# Patient Record
Sex: Female | Born: 1940 | Race: Black or African American | Hispanic: No | Marital: Married | Smoking: Never smoker
Health system: Southern US, Community
[De-identification: ages and names within clinical notes are randomized; demographics above are authoritative.]

## PROBLEM LIST (undated history)

## (undated) DIAGNOSIS — I10 Essential (primary) hypertension: Secondary | ICD-10-CM

## (undated) DIAGNOSIS — E785 Hyperlipidemia, unspecified: Secondary | ICD-10-CM

## (undated) DIAGNOSIS — J45909 Unspecified asthma, uncomplicated: Secondary | ICD-10-CM

## (undated) DIAGNOSIS — K219 Gastro-esophageal reflux disease without esophagitis: Secondary | ICD-10-CM

## (undated) DIAGNOSIS — R1310 Dysphagia, unspecified: Secondary | ICD-10-CM

## (undated) DIAGNOSIS — I2510 Atherosclerotic heart disease of native coronary artery without angina pectoris: Secondary | ICD-10-CM

## (undated) DIAGNOSIS — R1319 Other dysphagia: Secondary | ICD-10-CM

## (undated) HISTORY — PX: TUBAL LIGATION: SHX77

## (undated) HISTORY — PX: CORONARY ANGIOPLASTY: SHX604

## (undated) HISTORY — PX: DILATION AND CURETTAGE, DIAGNOSTIC / THERAPEUTIC: SUR384

## (undated) HISTORY — PX: CATARACT EXTRACTION: SUR2

## (undated) HISTORY — PX: CARDIAC CATHETERIZATION: SHX172

## (undated) HISTORY — PX: CHOLECYSTECTOMY: SHX55

---
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
## 2006-11-18 ENCOUNTER — Other Ambulatory Visit: Payer: Self-pay

## 2006-11-18 ENCOUNTER — Emergency Department: Payer: Self-pay | Admitting: Emergency Medicine

## 2008-06-02 ENCOUNTER — Ambulatory Visit: Payer: Self-pay | Admitting: Internal Medicine

## 2009-07-28 ENCOUNTER — Ambulatory Visit: Payer: Self-pay | Admitting: Internal Medicine

## 2009-12-13 ENCOUNTER — Emergency Department: Payer: Self-pay | Admitting: Emergency Medicine

## 2010-01-11 ENCOUNTER — Ambulatory Visit: Payer: Self-pay | Admitting: Specialist

## 2011-07-04 ENCOUNTER — Ambulatory Visit: Payer: Self-pay | Admitting: Internal Medicine

## 2012-07-24 ENCOUNTER — Ambulatory Visit: Payer: Self-pay | Admitting: Internal Medicine

## 2012-12-23 ENCOUNTER — Ambulatory Visit: Payer: Self-pay | Admitting: Internal Medicine

## 2013-11-16 ENCOUNTER — Emergency Department: Payer: Self-pay | Admitting: Emergency Medicine

## 2014-01-12 ENCOUNTER — Ambulatory Visit: Payer: Self-pay | Admitting: Internal Medicine

## 2014-11-11 ENCOUNTER — Other Ambulatory Visit: Payer: Self-pay | Admitting: Otolaryngology

## 2014-11-11 DIAGNOSIS — IMO0001 Reserved for inherently not codable concepts without codable children: Secondary | ICD-10-CM

## 2014-11-11 DIAGNOSIS — H918X1 Other specified hearing loss, right ear: Secondary | ICD-10-CM

## 2014-11-18 ENCOUNTER — Ambulatory Visit
Admission: RE | Admit: 2014-11-18 | Discharge: 2014-11-18 | Disposition: A | Discharge status: Home / Self Care | Payer: Medicare PPO | Source: Ambulatory Visit | Attending: Otolaryngology | Admitting: Otolaryngology

## 2014-11-18 DIAGNOSIS — IMO0001 Reserved for inherently not codable concepts without codable children: Secondary | ICD-10-CM

## 2014-11-18 DIAGNOSIS — H905 Unspecified sensorineural hearing loss: Secondary | ICD-10-CM | POA: Diagnosis not present

## 2014-11-18 DIAGNOSIS — H918X1 Other specified hearing loss, right ear: Secondary | ICD-10-CM

## 2014-11-18 MED ORDER — GADOBENATE DIMEGLUMINE 529 MG/ML IV SOLN
20.0000 mL | Freq: Once | INTRAVENOUS | Status: AC | PRN
  Administered 2014-11-18: 17 mL via INTRAVENOUS

## 2015-07-01 ENCOUNTER — Encounter: Payer: Self-pay | Admitting: *Deleted

## 2015-07-04 ENCOUNTER — Ambulatory Visit: Payer: Medicare PPO | Admitting: Anesthesiology

## 2015-07-04 ENCOUNTER — Encounter
Admission: RE | Disposition: A | Discharge status: Home / Self Care | Payer: Self-pay | Source: Ambulatory Visit | Attending: Gastroenterology

## 2015-07-04 ENCOUNTER — Encounter: Payer: Self-pay | Admitting: *Deleted

## 2015-07-04 ENCOUNTER — Ambulatory Visit
Admission: RE | Admit: 2015-07-04 | Discharge: 2015-07-04 | Disposition: A | Discharge status: Home / Self Care | Payer: Medicare PPO | Source: Ambulatory Visit | Attending: Gastroenterology | Admitting: Gastroenterology

## 2015-07-04 DIAGNOSIS — Z9049 Acquired absence of other specified parts of digestive tract: Secondary | ICD-10-CM | POA: Insufficient documentation

## 2015-07-04 DIAGNOSIS — Z9861 Coronary angioplasty status: Secondary | ICD-10-CM | POA: Insufficient documentation

## 2015-07-04 DIAGNOSIS — E785 Hyperlipidemia, unspecified: Secondary | ICD-10-CM | POA: Insufficient documentation

## 2015-07-04 DIAGNOSIS — Z79899 Other long term (current) drug therapy: Secondary | ICD-10-CM | POA: Diagnosis not present

## 2015-07-04 DIAGNOSIS — I10 Essential (primary) hypertension: Secondary | ICD-10-CM | POA: Diagnosis not present

## 2015-07-04 DIAGNOSIS — K640 First degree hemorrhoids: Secondary | ICD-10-CM | POA: Diagnosis not present

## 2015-07-04 DIAGNOSIS — I2510 Atherosclerotic heart disease of native coronary artery without angina pectoris: Secondary | ICD-10-CM | POA: Diagnosis not present

## 2015-07-04 DIAGNOSIS — K644 Residual hemorrhoidal skin tags: Secondary | ICD-10-CM | POA: Insufficient documentation

## 2015-07-04 DIAGNOSIS — D123 Benign neoplasm of transverse colon: Secondary | ICD-10-CM | POA: Diagnosis not present

## 2015-07-04 DIAGNOSIS — K5730 Diverticulosis of large intestine without perforation or abscess without bleeding: Secondary | ICD-10-CM | POA: Diagnosis not present

## 2015-07-04 DIAGNOSIS — Z1211 Encounter for screening for malignant neoplasm of colon: Secondary | ICD-10-CM | POA: Insufficient documentation

## 2015-07-04 DIAGNOSIS — Z7952 Long term (current) use of systemic steroids: Secondary | ICD-10-CM | POA: Insufficient documentation

## 2015-07-04 DIAGNOSIS — Z9889 Other specified postprocedural states: Secondary | ICD-10-CM | POA: Diagnosis not present

## 2015-07-04 DIAGNOSIS — D124 Benign neoplasm of descending colon: Secondary | ICD-10-CM | POA: Diagnosis not present

## 2015-07-04 DIAGNOSIS — J45909 Unspecified asthma, uncomplicated: Secondary | ICD-10-CM | POA: Insufficient documentation

## 2015-07-04 DIAGNOSIS — Z79891 Long term (current) use of opiate analgesic: Secondary | ICD-10-CM | POA: Diagnosis not present

## 2015-07-04 HISTORY — PX: COLONOSCOPY WITH PROPOFOL: SHX5780

## 2015-07-04 HISTORY — DX: Hyperlipidemia, unspecified: E78.5

## 2015-07-04 HISTORY — DX: Atherosclerotic heart disease of native coronary artery without angina pectoris: I25.10

## 2015-07-04 HISTORY — DX: Essential (primary) hypertension: I10

## 2015-07-04 HISTORY — DX: Unspecified asthma, uncomplicated: J45.909

## 2015-07-04 SURGERY — COLONOSCOPY WITH PROPOFOL
Anesthesia: General

## 2015-07-04 MED ORDER — SODIUM CHLORIDE 0.9 % IV SOLN
INTRAVENOUS | Status: DC
  Administered 2015-07-04 (×2): via INTRAVENOUS

## 2015-07-04 MED ORDER — MIDAZOLAM HCL 2 MG/2ML IJ SOLN
INTRAMUSCULAR | Status: DC | PRN
  Administered 2015-07-04: 1 mg via INTRAVENOUS

## 2015-07-04 MED ORDER — PROPOFOL 500 MG/50ML IV EMUL
INTRAVENOUS | Status: DC | PRN
  Administered 2015-07-04: 150 ug/kg/min via INTRAVENOUS

## 2015-07-04 MED ORDER — PROPOFOL 10 MG/ML IV BOLUS
INTRAVENOUS | Status: DC | PRN
  Administered 2015-07-04: 50 mg via INTRAVENOUS

## 2015-07-04 MED ORDER — FENTANYL CITRATE (PF) 100 MCG/2ML IJ SOLN
INTRAMUSCULAR | Status: DC | PRN
  Administered 2015-07-04: 50 ug via INTRAVENOUS

## 2015-07-04 NOTE — Anesthesia Preprocedure Evaluation (Signed)
Anesthesia Evaluation  Patient identified by MRN, date of birth, ID band Patient awake    Reviewed: Allergy & Precautions, H&P , NPO status , Patient's Chart, lab work & pertinent test results, reviewed documented beta blocker date and time   Airway Mallampati: II   Neck ROM: full    Dental  (+) Teeth Intact   Pulmonary neg pulmonary ROS, neg shortness of breath, asthma ,    Pulmonary exam normal        Cardiovascular Exercise Tolerance: Good hypertension, + CAD  negative cardio ROS Normal cardiovascular exam     Neuro/Psych negative neurological ROS  negative psych ROS   GI/Hepatic negative GI ROS, Neg liver ROS,   Endo/Other  negative endocrine ROS  Renal/GU negative Renal ROS  negative genitourinary   Musculoskeletal   Abdominal   Peds  Hematology negative hematology ROS (+)   Anesthesia Other Findings Past Medical History:   Hypertension                                                 Coronary artery disease                                      Asthma                                                       Hyperlipidemia                                             Past Surgical History:   CHOLECYSTECTOMY                                               CARDIAC CATHETERIZATION                                       CATARACT EXTRACTION                                           DILATION AND CURETTAGE, DIAGNOSTIC / THERAPEUT*               CORONARY ANGIOPLASTY                                        BMI    Body Mass Index   29.71 kg/m 2     Reproductive/Obstetrics                             Anesthesia Physical Anesthesia Plan  ASA: III  Anesthesia Plan:  General   Post-op Pain Management:    Induction:   Airway Management Planned:   Additional Equipment:   Intra-op Plan:   Post-operative Plan:   Informed Consent: I have reviewed the patients History and Physical, chart,  labs and discussed the procedure including the risks, benefits and alternatives for the proposed anesthesia with the patient or authorized representative who has indicated his/her understanding and acceptance.   Dental Advisory Given  Plan Discussed with: CRNA  Anesthesia Plan Comments:         Anesthesia Quick Evaluation

## 2015-07-04 NOTE — H&P (Signed)
  Primary Care Physician:  Madelyn Brunner, MD  Pre-Procedure History & Physical: HPI:  Melissa Alvarado is a 75 y.o. female is here for an colonoscopy.   Past Medical History  Diagnosis Date  . Hypertension   . Coronary artery disease   . Asthma   . Hyperlipidemia     Past Surgical History  Procedure Laterality Date  . Cholecystectomy    . Cardiac catheterization    . Cataract extraction    . Dilation and curettage, diagnostic / therapeutic    . Coronary angioplasty      Prior to Admission medications   Medication Sig Start Date End Date Taking? Authorizing Provider  Albuterol Sulfate 108 (90 Base) MCG/ACT AEPB Inhale 2 puffs into the lungs every 6 (six) hours as needed.   Yes Historical Provider, MD  azelastine (ASTELIN) 0.1 % nasal spray Place 1 spray into both nostrils 2 (two) times daily. Use in each nostril as directed   Yes Historical Provider, MD  conjugated estrogens (PREMARIN) vaginal cream Place 1 Applicatorful vaginally daily.   Yes Historical Provider, MD  gabapentin (NEURONTIN) 100 MG capsule Take 100 mg by mouth 3 (three) times daily.   Yes Historical Provider, MD  HYDROcodone-homatropine (HYCODAN) 5-1.5 MG/5ML syrup Take 5 mLs by mouth every 6 (six) hours as needed for cough.   Yes Historical Provider, MD  montelukast (SINGULAIR) 10 MG tablet Take 10 mg by mouth at bedtime. Reported on 07/04/2015   Yes Historical Provider, MD  potassium chloride (K-DUR,KLOR-CON) 10 MEQ tablet Take 10 mEq by mouth daily.   Yes Historical Provider, MD  predniSONE (DELTASONE) 20 MG tablet Take 20 mg by mouth daily with breakfast.   Yes Historical Provider, MD  quinapril-hydrochlorothiazide (ACCURETIC) 20-25 MG tablet Take 1 tablet by mouth daily.   Yes Historical Provider, MD    Allergies as of 06/08/2015  . (Not on File)    History reviewed. No pertinent family history.  Social History   Social History  . Marital Status: Married    Spouse Name: N/A  . Number of Children: N/A   . Years of Education: N/A   Occupational History  . Not on file.   Social History Main Topics  . Smoking status: Never Smoker   . Smokeless tobacco: Never Used  . Alcohol Use: No  . Drug Use: No  . Sexual Activity: Not on file   Other Topics Concern  . Not on file   Social History Narrative     Physical Exam: BP 142/50 mmHg  Pulse 84  Temp(Src) 97.1 F (36.2 C) (Tympanic)  Resp 16  Ht 5\' 6"  (1.676 m)  Wt 83.462 kg (184 lb)  BMI 29.71 kg/m2  SpO2 100% General:   Alert,  pleasant and cooperative in NAD Head:  Normocephalic and atraumatic. Neck:  Supple; no masses or thyromegaly. Lungs:  Clear throughout to auscultation.    Heart:  Regular rate and rhythm. Abdomen:  Soft, nontender and nondistended. Normal bowel sounds, without guarding, and without rebound.   Neurologic:  Alert and  oriented x4;  grossly normal neurologically.  Impression/Plan: Melissa Alvarado is here for an colonoscopy to be performed for screening  Risks, benefits, limitations, and alternatives regarding  colonoscopy have been reviewed with the patient.  Questions have been answered.  All parties agreeable.   Josefine Class, MD  07/04/2015, 9:20 AM

## 2015-07-04 NOTE — Transfer of Care (Signed)
Immediate Anesthesia Transfer of Care Note  Patient: Melissa Alvarado  Procedure(s) Performed: Procedure(s): COLONOSCOPY WITH PROPOFOL (N/A)  Patient Location: PACU and Endoscopy Unit  Anesthesia Type:General  Level of Consciousness: awake and patient cooperative  Airway & Oxygen Therapy: Patient Spontanous Breathing and Patient connected to nasal cannula oxygen  Post-op Assessment: Report given to RN and Post -op Vital signs reviewed and stable  Post vital signs: Reviewed and stable  Last Vitals:  Filed Vitals:   07/04/15 0849  BP: 142/50  Pulse: 84  Temp: 36.2 C  Resp: 16    Last Pain: There were no vitals filed for this visit.       Complications: No apparent anesthesia complications

## 2015-07-04 NOTE — Op Note (Signed)
[facility] Patient Name: Melissa Alvarado Procedure Date: 07/04/2015 9:33 AM MRN: [id number] Account #: [id number] Date of Birth: 09/01/1940 Admit Type: Outpatient Age: 75 Room: [facility] END[facility] ROOM 4 Gender: Female Note Status: Finalized Procedure:            Colonoscopy Indications:          Screening for colorectal malignant neoplasm, This is                        the patient's first colonoscopy Patient Profile:      This is a 75 year old female. Providers:            Gerrit Heck. Rayann Heman, MD Referring MD:         Hewitt Blade. Sarina Ser, MD (Referring MD) Medicines:            Propofol per Anesthesia Complications:        No immediate complications. Estimated blood loss:                        Minimal. Procedure:            Pre-Anesthesia Assessment:                       - Prior to the procedure, a History and Physical was                        performed, and patient medications, allergies and                        sensitivities were reviewed. The patient's tolerance of                        previous anesthesia was reviewed.                       After obtaining informed consent, the colonoscope was                        passed under direct vision. Throughout the procedure,                        the patient's blood pressure, pulse, and oxygen                        saturations were monitored continuously. The                        Colonoscope was introduced through the anus and                        advanced to the the cecum, identified by appendiceal                        orifice and ileocecal valve. The colonoscopy was                        performed without difficulty. The patient tolerated the                        procedure well. The quality of the bowel preparation  was excellent. Findings:      The perianal exam findings include non-thrombosed external hemorrhoids.      Two sessile polyps were found in the descending  colon and transverse       colon. The polyps were 3 mm in size. These polyps were removed with a       jumbo cold forceps. Resection and retrieval were complete.      A few small-mouthed diverticula were found in the sigmoid colon.      Internal hemorrhoids were found during retroflexion. The hemorrhoids       were Grade I (internal hemorrhoids that do not prolapse).      The exam was otherwise without abnormality. Impression:           - Non-thrombosed external hemorrhoids found on perianal                        exam.                       - Two 3 mm polyps in the descending colon and in the                        transverse colon, removed with a jumbo cold forceps.                        Resected and retrieved.                       - Diverticulosis in the sigmoid colon.                       - Internal hemorrhoids.                       - The examination was otherwise normal. Recommendation:       - Observe patient in GI recovery unit.                       - High fiber diet.                       - Continue present medications.                       - Await pathology results.                       - Return to referring physician.                       - Would not recommend further colon cancer screening                        based on age and todays exam.                       - The findings and recommendations were discussed with                        the patient.                       - The findings and recommendations were discussed with  the patient's family. Procedure Code(s):    --- Professional ---                       [phone], Colonoscopy, flexible; with biopsy, single or                        multiple Diagnosis Code(s):    --- Professional ---                       Z12.11, Encounter for screening for malignant neoplasm                        of colon                       D12.4, Benign neoplasm of descending colon                       D12.3,  Benign neoplasm of transverse colon (hepatic                        flexure or splenic flexure)                       K64.0, First degree hemorrhoids                       K64.4, Residual hemorrhoidal skin tags                       K57.30, Diverticulosis of large intestine without                        perforation or abscess without bleeding CPT copyright 2016 American Medical Association. All rights reserved. The codes documented in this report are preliminary and upon coder review may  be revised to meet current compliance requirements. Mellody Life, MD 07/04/2015 9:59:08 AM This report has been signed electronically. Number of Addenda: 0 Note Initiated On: 07/04/2015 9:33 AM Scope Withdrawal Time: 0 hours 10 minutes 44 seconds  Total Procedure Duration: 0 hours 16 minutes 27 seconds       [facility]

## 2015-07-04 NOTE — Discharge Instructions (Signed)

## 2015-07-05 ENCOUNTER — Encounter: Payer: Self-pay | Admitting: Gastroenterology

## 2015-07-05 LAB — SURGICAL PATHOLOGY

## 2015-07-05 NOTE — Anesthesia Postprocedure Evaluation (Signed)
Anesthesia Post Note  Patient: Melissa Alvarado  Procedure(s) Performed: Procedure(s) (LRB): COLONOSCOPY WITH PROPOFOL (N/A)  Patient location during evaluation: PACU Anesthesia Type: General Level of consciousness: awake and alert Pain management: pain level controlled Vital Signs Assessment: post-procedure vital signs reviewed and stable Respiratory status: spontaneous breathing, nonlabored ventilation, respiratory function stable and patient connected to nasal cannula oxygen Cardiovascular status: blood pressure returned to baseline and stable Postop Assessment: no signs of nausea or vomiting Anesthetic complications: no    Last Vitals:  Filed Vitals:   07/04/15 1023 07/04/15 1033  BP: 118/57 133/58  Pulse: 69 66  Temp:    Resp: 17 16    Last Pain:  Filed Vitals:   07/05/15 0743  PainSc: 0-No pain                 Molli Barrows

## 2015-08-12 NOTE — H&P (Signed)
RETURN GYN PATIENT VISIT  CC: PMB   Subjective:    Melissa Alvarado is a 75 y.o. female who presents for postmenopausal bleeding. Unable to perform endometrial biopsy in the office due to stenotic cervix. TVUS today shows endometrial fluid and a likely polyp.    For the past year, the patient has noticed thick, yellow vaginal discharge that bothers her. No smell. Some vulvar itching but no vaginal itching. Sometimes, she has vulvar burning when she wipes after urinating. She has also noticed several episodes of light pink discharge when she wipes, no frank blood. She is sexually active w/o difficulties, no dyspareunia. She went through menopause in her 70's, but these symptoms have been going on for a year, worsening over the past 3 months.  Started on premarin cream 10/2014 for vaginal discharge believed to be from atrophic vaginitis. This did not help the vulvar itching, the vulvar burning, or the discharge. Stopped a week ago.    She also endorsed a nodule on her right upper mons near her clitoral hood that has increased in size over the past month. It was biopsied and returned - RUPTURED EPIDERMOID CYST.   Gynecologic History No LMP recorded. Patient is postmenopausal.  Last Pap: 10 years ago Results were: normal Last mammogram: 2015. Results were: normal  Obstetric History                      OB History  Gravida Para Term Preterm AB SAB TAB Ectopic Multiple Living  2 2 2       2     # Outcome Date GA Lbr Len/2nd Weight Sex Delivery Anes PTL Lv  2 Term           1 Term               Past Medical History:  has a past medical history of Tubular adenoma of colon, unspecified (07/04/2015). Problem List: has Hypertension; Hyperlipidemia, unspecified; Asthma without status asthmaticus, unspecified; CAD (coronary artery disease); Chronic pain of right lower extremity; and PMB (postmenopausal bleeding) on her problem list. Past Surgical History:   has a past surgical history that includes Cholecystectomy (1995); Coronary angioplasty; Dilation and curettage, diagnostic / therapeutic (2004); Cataract extraction (2006); and Colonoscopy (07/04/2015). Family History: family history includes Aneurysm in her father and sister; Diabetes type II in her mother; Kidney disease in her brother and mother. There is no history of Colon cancer or Colon polyps. Social History:  reports that she has never smoked. She has never used smokeless tobacco. She reports that she does not drink alcohol or use illicit drugs. Current Medications: has a current medication list which includes the following prescription(s): albuterol, azelastine, gabapentin, montelukast, potassium chloride, quinapril-hydrochlorothiazide, conjugated estrogens, and prednisone. Prior to encounter Medications:        Current Outpatient Prescriptions on File Prior to Visit  Medication Sig Dispense Refill  . albuterol 90 mcg/actuation inhaler Inhale 2 inhalations into the lungs every 6 (six) hours as needed for Wheezing.    Marland Kitchen azelastine (ASTELIN) 137 mcg nasal spray     . gabapentin (NEURONTIN) 100 MG capsule TAKE 1 CAPSULE THREE TIMES DAILY 270 capsule 3  . montelukast (SINGULAIR) 10 mg tablet TAKE 1 TABLET BY MOUTH ONCE A DAY 90 tablet 3  . potassium chloride (KLOR-CON) 10 MEQ ER tablet Take 1 tablet (10 mEq total) by mouth once daily. 30 tablet 11  . quinapril-hydrochlorothiazide (ACCURETIC) 20-25 mg tablet TAKE 1 TABLET BY MOUTH  DAILY.  90 tablet 3  . conjugated estrogens (PREMARIN) 0.625 mg/gram vaginal cream Place 1 g vaginally once daily. (Patient not taking: Reported on 05/31/2015 ) 42.5 g 12  . predniSONE (DELTASONE) 20 MG tablet 2qam for 4 days, 1qam for 4days (Patient not taking: Reported on 05/31/2015 ) 12 tablet 0   No current facility-administered medications on file prior to visit.    Allergies: is allergic to lipitor [atorvastatin]; penicillins; sulfa (sulfonamide  antibiotics); and zetia [ezetimibe].  Review of Systems 14 systems reviewed pertinent positives and negatives as noted in the HPI and below.   Objective:       Vitals:   08/11/15 1006  BP: 144/71  Pulse: 69   General appearance: alert, appears stated age and cooperative Head: Normocephalic, without obvious abnormality, atraumatic  Lungs: clear to auscultation bilaterally Heart: regular rate and rhythm, S1, S2 normal, no murmur, click, rub or gallop   From previous visit: Abdomen: soft, non-tender; bowel sounds normal; no masses,  no organomegaly Pelvic: cervix normal in appearance, external genitalia normal, no adnexal masses or tenderness, no bladder tenderness, no cervical motion tenderness, rectovaginal septum normal, urethra without abnormality or discharge, uterus normal size, shape, and consistency, vagina normal without discharge and friable vaginal tissue at introitus, 49mm nodule with some fluctuance to the right of her clitoral hood, somewhat tender. vulva with thickened skin on outer labia, ashy, no lesions. 60mm area of excoriation in labial fold at level of clitoral hood (tender when wiping per patient); unable to dilate cervix even using the lacrimal dilators. Severe stenosis  Extremities: extremities normal, atraumatic, no cyanosis or edema Skin: Skin color, texture, turgor normal. No rashes or lesions Lymph nodes: Inguinal adenopathy: none     TVUS today Two fibroids seen 1 lt ant=21.6 mm 2 rt post=19.6 mm Endometrium=5.90 mm Fluid seen in endometrium with a possible polyp bil ovs wnl   Assessment:    75yo G2P2 w/ PMB, stenotic cervix, and evidence of endometrial fluid/polyp on TVUS. Plan for [facility] hysteroscopy, polypectomy. R/B/A reviewed with patient including risks of bleeding, pain, infection, uterine perforation, or injury to surrounding organs.  Plan:    1. PMB (postmenopausal bleeding) -  to the OR for a D&C given how stenotic her cervix is - consent  signed  Joylene Igo, MD

## 2015-08-18 ENCOUNTER — Encounter
Admission: RE | Admit: 2015-08-18 | Discharge: 2015-08-18 | Disposition: A | Discharge status: Home / Self Care | Payer: Medicare PPO | Source: Ambulatory Visit | Attending: Obstetrics and Gynecology | Admitting: Obstetrics and Gynecology

## 2015-08-18 DIAGNOSIS — N950 Postmenopausal bleeding: Secondary | ICD-10-CM | POA: Diagnosis not present

## 2015-08-18 DIAGNOSIS — Z01812 Encounter for preprocedural laboratory examination: Secondary | ICD-10-CM | POA: Diagnosis not present

## 2015-08-18 HISTORY — DX: Gastro-esophageal reflux disease without esophagitis: K21.9

## 2015-08-18 LAB — CBC
HEMATOCRIT: 38.4 % (ref 35.0–47.0)
Hemoglobin: 12.4 g/dL (ref 12.0–16.0)
MCH: 26.1 pg (ref 26.0–34.0)
MCHC: 32.4 g/dL (ref 32.0–36.0)
MCV: 80.4 fL (ref 80.0–100.0)
PLATELETS: 276 10*3/uL (ref 150–440)
RBC: 4.78 MIL/uL (ref 3.80–5.20)
RDW: 14.9 % — AB (ref 11.5–14.5)
WBC: 5.3 10*3/uL (ref 3.6–11.0)

## 2015-08-18 NOTE — Patient Instructions (Signed)
Your procedure is scheduled on: Friday 08/26/15 Report to Day Surgery. 2ND FLOOR MEDICAL MALL ENTRANCE To find out your arrival time please call [phone] between 1PM - 3PM on Thursday 08/25/15.  Remember: Instructions that are not followed completely may result in serious medical risk, up to and including death, or upon the discretion of your surgeon and anesthesiologist your surgery may need to be rescheduled.    __X__ 1. Do not eat food or drink liquids after midnight. No gum chewing or hard candies.     __X__ 2. No Alcohol for 24 hours before or after surgery.   ____ 3. Bring all medications with you on the day of surgery if instructed.    __X__ 4. Notify your doctor if there is any change in your medical condition     (cold, fever, infections).     Do not wear jewelry, make-up, hairpins, clips or nail polish.  Do not wear lotions, powders, or perfumes.   Do not shave 48 hours prior to surgery. Men may shave face and neck.  Do not bring valuables to the hospital.    [facility] is not responsible for any belongings or valuables.               Contacts, dentures or bridgework may not be worn into surgery.  Leave your suitcase in the car. After surgery it may be brought to your room.  For patients admitted to the hospital, discharge time is determined by your                treatment team.   Patients discharged the day of surgery will not be allowed to drive home.   Please read over the following fact sheets that you were given:   Surgical Site Infection Prevention   __X__ Take these medicines the morning of surgery with A SIP OF WATER:    1. GABAPENTIN  2.   3.   4.  5.  6.  ____ Fleet Enema (as directed)   ____ Use CHG Soap as directed  __X__ Use inhalers on the day of surgery AND BRING WITH YOU  ____ Stop metformin 2 days prior to surgery    ____ Take 1/2 of usual insulin dose the night before surgery and none on the morning of surgery.   ____ Stop  Coumadin/Plavix/aspirin on   ____ Stop Anti-inflammatories on    ____ Stop supplements until after surgery.    ____ Bring C-Pap to the hospital.

## 2015-08-18 NOTE — Pre-Procedure Instructions (Signed)
ANESTHESIA - EKG   ECG 12-lead3/12/2015  [place]  Component Name Value Range  Vent Rate (bpm) 73   PR Interval (msec) 172   QRS Interval (msec) 86   QT Interval (msec) 392   QTc (msec) 431    Result Narrative  Sinus rhythm with occasional premature ventricular complexes Nonspecific T wave abnormalities Abnormal ECG No previous ECGs available I reviewed and concur with this report. Electronically signed [id number] MD, Hewitt Blade [phone]) on 05/31/2015 1:23:29 PM   Status Results Details

## 2015-08-19 LAB — TYPE AND SCREEN
ABO/RH(D): O POS
Antibody Screen: NEGATIVE

## 2015-08-26 ENCOUNTER — Encounter: Payer: Self-pay | Admitting: *Deleted

## 2015-08-26 ENCOUNTER — Ambulatory Visit
Admission: RE | Admit: 2015-08-26 | Discharge: 2015-08-26 | Disposition: A | Discharge status: Home / Self Care | Payer: Medicare PPO | Source: Ambulatory Visit | Attending: Obstetrics and Gynecology | Admitting: Obstetrics and Gynecology

## 2015-08-26 ENCOUNTER — Ambulatory Visit: Payer: Medicare PPO | Admitting: Anesthesiology

## 2015-08-26 ENCOUNTER — Encounter
Admission: RE | Disposition: A | Discharge status: Home / Self Care | Payer: Self-pay | Source: Ambulatory Visit | Attending: Obstetrics and Gynecology

## 2015-08-26 DIAGNOSIS — K219 Gastro-esophageal reflux disease without esophagitis: Secondary | ICD-10-CM | POA: Diagnosis not present

## 2015-08-26 DIAGNOSIS — N950 Postmenopausal bleeding: Secondary | ICD-10-CM | POA: Insufficient documentation

## 2015-08-26 DIAGNOSIS — N882 Stricture and stenosis of cervix uteri: Secondary | ICD-10-CM | POA: Insufficient documentation

## 2015-08-26 DIAGNOSIS — I10 Essential (primary) hypertension: Secondary | ICD-10-CM | POA: Insufficient documentation

## 2015-08-26 DIAGNOSIS — J45909 Unspecified asthma, uncomplicated: Secondary | ICD-10-CM | POA: Diagnosis not present

## 2015-08-26 DIAGNOSIS — I2510 Atherosclerotic heart disease of native coronary artery without angina pectoris: Secondary | ICD-10-CM | POA: Insufficient documentation

## 2015-08-26 DIAGNOSIS — N840 Polyp of corpus uteri: Secondary | ICD-10-CM | POA: Insufficient documentation

## 2015-08-26 HISTORY — PX: HYSTEROSCOPY WITH D & C: SHX1775

## 2015-08-26 SURGERY — DILATATION AND CURETTAGE /HYSTEROSCOPY
Anesthesia: General | Wound class: Clean Contaminated

## 2015-08-26 MED ORDER — ONDANSETRON HCL 4 MG/2ML IJ SOLN
INTRAMUSCULAR | Status: DC | PRN
  Administered 2015-08-26: 4 mg via INTRAVENOUS

## 2015-08-26 MED ORDER — FAMOTIDINE 20 MG PO TABS
20.0000 mg | ORAL_TABLET | Freq: Once | ORAL | Status: AC
  Administered 2015-08-26: 20 mg via ORAL

## 2015-08-26 MED ORDER — FENTANYL CITRATE (PF) 100 MCG/2ML IJ SOLN: 25.0000 ug | INTRAMUSCULAR | Status: DC | PRN

## 2015-08-26 MED ORDER — KETOROLAC TROMETHAMINE 30 MG/ML IJ SOLN
INTRAMUSCULAR | Status: AC
  Filled 2015-08-26: qty 1

## 2015-08-26 MED ORDER — MIDAZOLAM HCL 2 MG/2ML IJ SOLN
INTRAMUSCULAR | Status: DC | PRN
  Administered 2015-08-26: 1 mg via INTRAVENOUS

## 2015-08-26 MED ORDER — LACTATED RINGERS IV SOLN
INTRAVENOUS | Status: DC
  Administered 2015-08-26: 07:00:00 via INTRAVENOUS

## 2015-08-26 MED ORDER — EPHEDRINE SULFATE 50 MG/ML IJ SOLN
INTRAMUSCULAR | Status: DC | PRN
  Administered 2015-08-26 (×2): 5 mg via INTRAVENOUS

## 2015-08-26 MED ORDER — FAMOTIDINE 20 MG PO TABS
ORAL_TABLET | ORAL | Status: AC
  Filled 2015-08-26: qty 1

## 2015-08-26 MED ORDER — LIDOCAINE HCL (CARDIAC) 20 MG/ML IV SOLN
INTRAVENOUS | Status: DC | PRN
  Administered 2015-08-26: 100 mg via INTRAVENOUS

## 2015-08-26 MED ORDER — DEXAMETHASONE SODIUM PHOSPHATE 10 MG/ML IJ SOLN
INTRAMUSCULAR | Status: DC | PRN
  Administered 2015-08-26: 5 mg via INTRAVENOUS

## 2015-08-26 MED ORDER — KETOROLAC TROMETHAMINE 15 MG/ML IJ SOLN
15.0000 mg | Freq: Four times a day (QID) | INTRAMUSCULAR | Status: DC | PRN
  Filled 2015-08-26: qty 1

## 2015-08-26 MED ORDER — KETOROLAC TROMETHAMINE 15 MG/ML IJ SOLN
15.0000 mg | Freq: Four times a day (QID) | INTRAMUSCULAR | Status: AC
  Administered 2015-08-26: 15 mg via INTRAVENOUS
  Filled 2015-08-26: qty 1

## 2015-08-26 MED ORDER — PROPOFOL 10 MG/ML IV BOLUS
INTRAVENOUS | Status: DC | PRN
  Administered 2015-08-26: 150 mg via INTRAVENOUS

## 2015-08-26 MED ORDER — FENTANYL CITRATE (PF) 100 MCG/2ML IJ SOLN
INTRAMUSCULAR | Status: DC | PRN
  Administered 2015-08-26 (×2): 50 ug via INTRAVENOUS

## 2015-08-26 MED ORDER — ONDANSETRON HCL 4 MG/2ML IJ SOLN: 4.0000 mg | Freq: Once | INTRAMUSCULAR | Status: DC | PRN

## 2015-08-26 SURGICAL SUPPLY — 18 items
CANISTER SUCT 3000ML (MISCELLANEOUS) ×3 IMPLANT
CATH ROBINSON RED A/P 16FR (CATHETERS) ×3 IMPLANT
ELECT REM PT RETURN 9FT ADLT (ELECTROSURGICAL) ×3
ELECT RESECT POWERBALL 24F (MISCELLANEOUS) IMPLANT
ELECTRODE REM PT RTRN 9FT ADLT (ELECTROSURGICAL) ×1 IMPLANT
GLOVE BIO SURGEON STRL SZ7 (GLOVE) ×3 IMPLANT
GLOVE BIOGEL PI IND STRL 7.5 (GLOVE) ×1 IMPLANT
GLOVE BIOGEL PI INDICATOR 7.5 (GLOVE) ×2
GOWN STRL REUS W/ TWL LRG LVL3 (GOWN DISPOSABLE) ×2 IMPLANT
GOWN STRL REUS W/TWL LRG LVL3 (GOWN DISPOSABLE) ×4
IV LACTATED RINGERS 1000ML (IV SOLUTION) ×3 IMPLANT
KIT RM TURNOVER CYSTO AR (KITS) ×3 IMPLANT
LOOP CUT RT ANGL 28F (MISCELLANEOUS) IMPLANT
PACK DNC HYST (MISCELLANEOUS) ×3 IMPLANT
PAD OB MATERNITY 4.3X12.25 (PERSONAL CARE ITEMS) ×3 IMPLANT
PAD PREP 24X41 OB/GYN DISP (PERSONAL CARE ITEMS) ×3 IMPLANT
TUBING CONNECTING 10 (TUBING) ×2 IMPLANT
TUBING CONNECTING 10' (TUBING) ×1

## 2015-08-26 NOTE — Discharge Instructions (Signed)
Hysteroscopy, Care After Refer to this sheet in the next few weeks. These instructions provide you with information on caring for yourself after your procedure. Your health care provider may also give you more specific instructions. Your treatment has been planned according to current medical practices, but problems sometimes occur. Call your health care provider if you have any problems or questions after your procedure.  WHAT TO EXPECT AFTER THE PROCEDURE After your procedure, it is typical to have the following:  You may have some cramping. This normally lasts for a couple days.  You may have bleeding. This can vary from light spotting for a few days to menstrual-like bleeding for 3-7 days. HOME CARE INSTRUCTIONS  Rest for the first 1-2 days after the procedure.  Only take over-the-counter or prescription medicines as directed by your health care provider. Do not take aspirin. It can increase the chances of bleeding.  Take showers instead of baths for 2 weeks or as directed by your health care provider.  Do not drive for 24 hours or as directed.  Do not use tampons, douche, or have sexual intercourse for 2 weeks or until your health care provider says it is okay.  Normal diet, exercise, and lifting.  If you develop constipation, you may:  Take a mild laxative if your health care provider approves.  Add bran foods to your diet.  Drink enough fluids to keep your urine clear or pale yellow.  Try to have someone with you or available to you for the first 24-48 hours, especially if you were given a general anesthetic.  Follow up with your health care provider as directed. SEEK MEDICAL CARE IF:  You feel dizzy or lightheaded.  You feel sick to your stomach (nauseous).  You have abnormal vaginal discharge.  You have a rash.  You have pain that is not controlled with medicine. SEEK IMMEDIATE MEDICAL CARE IF:  You have bleeding that is heavier than a normal menstrual  period.  You have a fever.  You have increasing cramps or pain, not controlled with medicine.  You have new belly (abdominal) pain.  You pass out.  You have pain in the tops of your shoulders (shoulder strap areas).  You have shortness of breath.   This information is not intended to replace advice given to you by your health care provider. Make sure you discuss any questions you have with your health care provider.   Document Released: 12/10/2012 Document Reviewed: 12/10/2012 Elsevier Interactive Patient Education 2016 [place]   1) The drugs that you were given will stay in your system until tomorrow so for the next 24 hours you should not:  A) Drive an automobile B) Make any legal decisions C) Drink any alcoholic beverage   2) You may resume regular meals tomorrow.  Today it is better to start with liquids and gradually work up to solid foods.  You may eat anything you prefer, but it is better to start with liquids, then soup and crackers, and gradually work up to solid foods.   3) Please notify your doctor immediately if you have any unusual bleeding, trouble breathing, redness and pain at the surgery site, drainage, fever, or pain not relieved by medication.    4) Additional Instructions:        Please contact your physician with any problems or Same Day Surgery at [phone], Monday through Friday 6 am to 4 pm, or [facility] number  at [phone].

## 2015-08-26 NOTE — Op Note (Signed)
Operative Report Hysteroscopy with Dilation and Curettage   Indications: PMB, cervical stenosis   Pre-operative Diagnosis: PMB, cervical stenosis, endometrial polyp   Post-operative Diagnosis: same.  Procedure: 1. Exam under anesthesia 2. Fractional D&C 3. Hysteroscopy 4. Polypectomy  Surgeon: Joylene Igo, MD  Assistant(s):  None  Anesthesia: General endotracheal anesthesia  Anesthesiologist: Alvin Critchley, MD Anesthesiologist: Alvin Critchley, MD CRNA: Aline Brochure, CRNA  Estimated Blood Loss:  Minimal         Intraoperative medications: none         Total IV Fluids: 576ml  Urine Output: 14ml         Specimens: Endocervical curettings, endometrial curettings         Complications:  None; patient tolerated the procedure well.         Disposition: PACU - hemodynamically stable.         Condition: stable  Findings: Stenotic cervical canal, uterus measuring 7 cm by sound; normal cervix, vagina, perineum. Small endometrial polyp on hysteroscopy  Indication for procedure/Consents: 75 y.o. No obstetric history on file.  here for scheduled surgery for the aforementioned diagnoses.  Risks of surgery were discussed with the patient including but not limited to: bleeding which may require transfusion; infection which may require antibiotics; injury to uterus or surrounding organs; intrauterine scarring which may impair future fertility; need for additional procedures including laparotomy or laparoscopy; and other postoperative/anesthesia complications. Written informed consent was obtained.    Procedure Details:   The patient was taken to the operating room where anesthesia was administered and was found to be adequate. After a formal and adequate timeout was performed, she was placed in the dorsal lithotomy position and examined with the above findings. She was then prepped and draped in the sterile manner. Her bladder was catheterized for an estimated amount of clear,  yellow urine. A speculum was then placed in the patient's vagina and a single tooth tenaculum was applied to the anterior lip of the cervix.  Her cervix was initially dilated with lacrimal dilators. Once entry was achieved, the cervix was then serially dilated to 56mm using Hegar dilators.  Her uterus was sounded to 7 cm. The hysteroscope was introduced to reveal the above findings. Endometrial polypectomy was performed with polypectomy forceps.   A sharp curettage was then performed until there was a gritty texture in all four quadrants. The tenaculum was removed from the anterior lip of the cervix and the vaginal speculum was removed after applying pressure for good hemostasis.   The patient tolerated the procedure well and was taken to the recovery area awake and in stable condition.   The patient will be discharged to home as per PACU criteria. Routine postoperative instructions given. She will follow up in the clinic in one week for postoperative evaluation.  Lorette Ang, MD

## 2015-08-26 NOTE — Interval H&P Note (Signed)
History and Physical Interval Note:  08/26/2015 7:27 AM  Melissa Alvarado  has presented today for surgery, with the diagnosis of PMB  Polyp  The various methods of treatment have been discussed with the patient and family. After consideration of risks, benefits and other options for treatment, the patient has consented to  Procedure(s): DILATATION AND CURETTAGE /HYSTEROSCOPY (N/A) as a surgical intervention .  The patient's history has been reviewed, patient examined, no change in status, stable for surgery.  I have reviewed the patient's chart and labs.  Questions were answered to the patient's satisfaction.     [place]

## 2015-08-26 NOTE — Anesthesia Procedure Notes (Signed)
Procedure Name: LMA Insertion Date/Time: 08/26/2015 7:47 AM Performed by: Aline Brochure Pre-anesthesia Checklist: Patient identified, Emergency Drugs available, Suction available and Patient being monitored Patient Re-evaluated:Patient Re-evaluated prior to inductionOxygen Delivery Method: Circle system utilized Preoxygenation: Pre-oxygenation with 100% oxygen Intubation Type: IV induction Ventilation: Mask ventilation without difficulty LMA: LMA inserted LMA Size: 3.5 Number of attempts: 1 Placement Confirmation: positive ETCO2 and breath sounds checked- equal and bilateral Tube secured with: Tape Dental Injury: Teeth and Oropharynx as per pre-operative assessment

## 2015-08-26 NOTE — Anesthesia Preprocedure Evaluation (Signed)
Anesthesia Evaluation  Patient identified by MRN, date of birth, ID band Patient awake    Reviewed: Allergy & Precautions, NPO status , Patient's Chart, lab work & pertinent test results  Airway Mallampati: II  TM Distance: >3 FB     Dental  (+) Partial Lower, Partial Upper   Pulmonary asthma ,    Pulmonary exam normal        Cardiovascular hypertension, Pt. on medications + CAD  Normal cardiovascular exam     Neuro/Psych negative neurological ROS  negative psych ROS   GI/Hepatic Neg liver ROS, GERD  Medicated and Controlled,  Endo/Other  negative endocrine ROS  Renal/GU negative Renal ROS  negative genitourinary   Musculoskeletal negative musculoskeletal ROS (+)   Abdominal Normal abdominal exam  (+)   Peds negative pediatric ROS (+)  Hematology negative hematology ROS (+)   Anesthesia Other Findings   Reproductive/Obstetrics                             Anesthesia Physical Anesthesia Plan  ASA: II  Anesthesia Plan: General   Post-op Pain Management:    Induction: Intravenous  Airway Management Planned: LMA  Additional Equipment:   Intra-op Plan:   Post-operative Plan: Extubation in OR  Informed Consent: I have reviewed the patients History and Physical, chart, labs and discussed the procedure including the risks, benefits and alternatives for the proposed anesthesia with the patient or authorized representative who has indicated his/her understanding and acceptance.   Dental advisory given  Plan Discussed with: CRNA and Surgeon  Anesthesia Plan Comments:         Anesthesia Quick Evaluation

## 2015-08-26 NOTE — Transfer of Care (Signed)
Immediate Anesthesia Transfer of Care Note  Patient: Melissa Alvarado  Procedure(s) Performed: Procedure(s): DILATATION AND CURETTAGE /HYSTEROSCOPY (N/A)  Patient Location: PACU  Anesthesia Type:General  Level of Consciousness: sedated  Airway & Oxygen Therapy: Patient Spontanous Breathing and Patient connected to face mask oxygen  Post-op Assessment: Post -op Vital signs reviewed and stable  Post vital signs: stable  Last Vitals:  Filed Vitals:   08/26/15 0652 08/26/15 0827  BP: 146/65 169/92  Pulse: 76 65  Temp: 36.6 C 36.4 C  Resp: 14 14    Last Pain:  Filed Vitals:   08/26/15 0827  PainSc: 4          Complications: No apparent anesthesia complications

## 2015-08-26 NOTE — Anesthesia Postprocedure Evaluation (Signed)
Anesthesia Post Note  Patient: Melissa Alvarado  Procedure(s) Performed: Procedure(s) (LRB): DILATATION AND CURETTAGE /HYSTEROSCOPY (N/A)  Patient location during evaluation: PACU Anesthesia Type: General Level of consciousness: awake and alert and oriented Pain management: pain level controlled Vital Signs Assessment: post-procedure vital signs reviewed and stable Respiratory status: spontaneous breathing Cardiovascular status: blood pressure returned to baseline Anesthetic complications: no    Last Vitals:  Filed Vitals:   08/26/15 0930 08/26/15 0936  BP: 164/75 152/57  Pulse: 62 59  Temp: 36.4 C   Resp: 18 18    Last Pain:  Filed Vitals:   08/26/15 0946  PainSc: 0-No pain                 [name]

## 2015-08-29 LAB — SURGICAL PATHOLOGY

## 2017-06-25 ENCOUNTER — Other Ambulatory Visit: Payer: Self-pay | Admitting: Internal Medicine

## 2017-06-25 DIAGNOSIS — Z1231 Encounter for screening mammogram for malignant neoplasm of breast: Secondary | ICD-10-CM

## 2017-07-11 ENCOUNTER — Ambulatory Visit
Admission: RE | Admit: 2017-07-11 | Discharge: 2017-07-11 | Disposition: A | Discharge status: Home / Self Care | Payer: Medicare PPO | Source: Ambulatory Visit | Attending: Internal Medicine | Admitting: Internal Medicine

## 2017-07-11 DIAGNOSIS — Z1231 Encounter for screening mammogram for malignant neoplasm of breast: Secondary | ICD-10-CM | POA: Diagnosis not present

## 2018-04-01 ENCOUNTER — Other Ambulatory Visit
Admission: RE | Admit: 2018-04-01 | Discharge: 2018-04-01 | Disposition: A | Discharge status: Home / Self Care | Payer: Medicare PPO | Source: Other Acute Inpatient Hospital | Attending: Otolaryngology | Admitting: Otolaryngology

## 2018-04-01 DIAGNOSIS — J9601 Acute respiratory failure with hypoxia: Secondary | ICD-10-CM | POA: Diagnosis present

## 2018-04-01 LAB — FIBRIN DERIVATIVES D-DIMER (ARMC ONLY): FIBRIN DERIVATIVES D-DIMER (ARMC): 595.59 ng{FEU}/mL — AB (ref 0.00–499.00)

## 2018-04-02 ENCOUNTER — Other Ambulatory Visit: Payer: Self-pay | Admitting: Pulmonary Disease

## 2018-04-02 ENCOUNTER — Ambulatory Visit
Admission: RE | Admit: 2018-04-02 | Discharge: 2018-04-02 | Disposition: A | Discharge status: Home / Self Care | Payer: Medicare PPO | Source: Ambulatory Visit | Attending: Pulmonary Disease | Admitting: Pulmonary Disease

## 2018-04-02 DIAGNOSIS — R7989 Other specified abnormal findings of blood chemistry: Secondary | ICD-10-CM | POA: Insufficient documentation

## 2018-04-02 LAB — POCT I-STAT CREATININE: Creatinine, Ser: 1.1 mg/dL — ABNORMAL HIGH (ref 0.44–1.00)

## 2018-04-02 MED ORDER — IOPAMIDOL (ISOVUE-370) INJECTION 76%
75.0000 mL | Freq: Once | INTRAVENOUS | Status: AC | PRN
  Administered 2018-04-02: 75 mL via INTRAVENOUS

## 2019-02-03 ENCOUNTER — Other Ambulatory Visit: Payer: Self-pay | Admitting: Infectious Diseases

## 2019-02-03 DIAGNOSIS — M79671 Pain in right foot: Secondary | ICD-10-CM

## 2019-02-03 DIAGNOSIS — M79672 Pain in left foot: Secondary | ICD-10-CM

## 2019-02-03 DIAGNOSIS — R0989 Other specified symptoms and signs involving the circulatory and respiratory systems: Secondary | ICD-10-CM

## 2019-02-06 ENCOUNTER — Other Ambulatory Visit: Payer: Self-pay

## 2019-02-06 ENCOUNTER — Ambulatory Visit
Admission: RE | Admit: 2019-02-06 | Discharge: 2019-02-06 | Disposition: A | Discharge status: Home / Self Care | Payer: Medicare PPO | Source: Ambulatory Visit | Attending: Infectious Diseases | Admitting: Infectious Diseases

## 2019-02-06 DIAGNOSIS — R0989 Other specified symptoms and signs involving the circulatory and respiratory systems: Secondary | ICD-10-CM | POA: Diagnosis present

## 2019-02-06 DIAGNOSIS — M79672 Pain in left foot: Secondary | ICD-10-CM | POA: Diagnosis present

## 2019-02-06 DIAGNOSIS — M79671 Pain in right foot: Secondary | ICD-10-CM | POA: Insufficient documentation

## 2019-03-05 ENCOUNTER — Encounter: Payer: Self-pay | Admitting: Internal Medicine

## 2019-03-05 ENCOUNTER — Other Ambulatory Visit
Admission: RE | Admit: 2019-03-05 | Discharge: 2019-03-05 | Disposition: A | Discharge status: Home / Self Care | Payer: Medicare PPO | Source: Ambulatory Visit | Attending: Internal Medicine | Admitting: Internal Medicine

## 2019-03-05 ENCOUNTER — Other Ambulatory Visit: Payer: Self-pay

## 2019-03-05 DIAGNOSIS — Z20828 Contact with and (suspected) exposure to other viral communicable diseases: Secondary | ICD-10-CM | POA: Insufficient documentation

## 2019-03-05 DIAGNOSIS — Z01812 Encounter for preprocedural laboratory examination: Secondary | ICD-10-CM | POA: Diagnosis present

## 2019-03-06 LAB — SARS CORONAVIRUS 2 (TAT 6-24 HRS): SARS Coronavirus 2: NEGATIVE

## 2019-03-09 ENCOUNTER — Encounter: Payer: Self-pay | Admitting: Internal Medicine

## 2019-03-09 ENCOUNTER — Other Ambulatory Visit: Payer: Self-pay

## 2019-03-09 ENCOUNTER — Encounter
Admission: RE | Disposition: A | Discharge status: Home / Self Care | Payer: Self-pay | Source: Home / Self Care | Attending: Internal Medicine

## 2019-03-09 ENCOUNTER — Ambulatory Visit
Admission: RE | Admit: 2019-03-09 | Discharge: 2019-03-09 | Disposition: A | Discharge status: Home / Self Care | Payer: Medicare PPO | Attending: Internal Medicine | Admitting: Internal Medicine

## 2019-03-09 ENCOUNTER — Ambulatory Visit: Payer: Medicare PPO | Admitting: Anesthesiology

## 2019-03-09 DIAGNOSIS — K219 Gastro-esophageal reflux disease without esophagitis: Secondary | ICD-10-CM | POA: Diagnosis not present

## 2019-03-09 DIAGNOSIS — K449 Diaphragmatic hernia without obstruction or gangrene: Secondary | ICD-10-CM | POA: Insufficient documentation

## 2019-03-09 DIAGNOSIS — I10 Essential (primary) hypertension: Secondary | ICD-10-CM | POA: Diagnosis not present

## 2019-03-09 DIAGNOSIS — E785 Hyperlipidemia, unspecified: Secondary | ICD-10-CM | POA: Insufficient documentation

## 2019-03-09 DIAGNOSIS — K222 Esophageal obstruction: Secondary | ICD-10-CM | POA: Insufficient documentation

## 2019-03-09 DIAGNOSIS — J45909 Unspecified asthma, uncomplicated: Secondary | ICD-10-CM | POA: Diagnosis not present

## 2019-03-09 DIAGNOSIS — K228 Other specified diseases of esophagus: Secondary | ICD-10-CM | POA: Insufficient documentation

## 2019-03-09 DIAGNOSIS — I2510 Atherosclerotic heart disease of native coronary artery without angina pectoris: Secondary | ICD-10-CM | POA: Insufficient documentation

## 2019-03-09 DIAGNOSIS — R1314 Dysphagia, pharyngoesophageal phase: Secondary | ICD-10-CM | POA: Diagnosis present

## 2019-03-09 DIAGNOSIS — Z880 Allergy status to penicillin: Secondary | ICD-10-CM | POA: Insufficient documentation

## 2019-03-09 DIAGNOSIS — Z7951 Long term (current) use of inhaled steroids: Secondary | ICD-10-CM | POA: Diagnosis not present

## 2019-03-09 DIAGNOSIS — Z79899 Other long term (current) drug therapy: Secondary | ICD-10-CM | POA: Diagnosis not present

## 2019-03-09 HISTORY — DX: Other dysphagia: R13.19

## 2019-03-09 HISTORY — PX: ESOPHAGOGASTRODUODENOSCOPY (EGD) WITH PROPOFOL: SHX5813

## 2019-03-09 SURGERY — ESOPHAGOGASTRODUODENOSCOPY (EGD) WITH PROPOFOL
Anesthesia: General

## 2019-03-09 MED ORDER — PROPOFOL 500 MG/50ML IV EMUL
INTRAVENOUS | Status: DC | PRN
  Administered 2019-03-09: 150 ug/kg/min via INTRAVENOUS

## 2019-03-09 MED ORDER — SODIUM CHLORIDE 0.9 % IV SOLN: INTRAVENOUS | Status: DC

## 2019-03-09 MED ORDER — PROPOFOL 10 MG/ML IV BOLUS
INTRAVENOUS | Status: DC | PRN
  Administered 2019-03-09: 60 mg via INTRAVENOUS

## 2019-03-09 MED ORDER — LIDOCAINE HCL (PF) 2 % IJ SOLN
INTRAMUSCULAR | Status: DC | PRN
  Administered 2019-03-09: 100 mg via INTRADERMAL

## 2019-03-09 NOTE — Anesthesia Procedure Notes (Signed)
Date/Time: 03/09/2019 10:44 AM Performed by: Nelda Marseille, CRNA Pre-anesthesia Checklist: Patient identified, Emergency Drugs available, Suction available, Patient being monitored and Timeout performed Oxygen Delivery Method: Nasal cannula

## 2019-03-09 NOTE — Interval H&P Note (Signed)
History and Physical Interval Note:  03/09/2019 9:59 AM  Melissa Alvarado  has presented today for surgery, with the diagnosis of DYSPHAGIA.  The various methods of treatment have been discussed with the patient and family. After consideration of risks, benefits and other options for treatment, the patient has consented to  Procedure(s): ESOPHAGOGASTRODUODENOSCOPY (EGD) WITH PROPOFOL (N/A) as a surgical intervention.  The patient's history has been reviewed, patient examined, no change in status, stable for surgery.  I have reviewed the patient's chart and labs.  Questions were answered to the patient's satisfaction.     [place], [place]

## 2019-03-09 NOTE — H&P (Signed)
Outpatient short stay form Pre-procedure 03/09/2019 9:58 AM Melissa Alvarado K. Alice Reichert, M.D.  Primary Physician: Harrel Lemon, M.D.  Reason for visit:  Dysphagia, GERD  History of present illness:  Patient is a pleasant 79 y/o female presenting with intermittent solid food dysphagia to the mid to distal sternum with involuntary weight loss. Takes TUMS for "indigestion" but no other antacids.     Current Facility-Administered Medications:  .  0.9 %  sodium chloride infusion, , Intravenous, Continuous, Andrell Bergeson, Benay Pike, MD  Medications Prior to Admission  Medication Sig Dispense Refill Last Dose  . albuterol (ACCUNEB) 1.25 MG/3ML nebulizer solution Take 1 ampule by nebulization every 6 (six) hours as needed for wheezing.   Past Month at Unknown time  . albuterol (PROAIR HFA) 108 (90 Base) MCG/ACT inhaler Inhale 2 puffs into the lungs every 4 (four) hours as needed.   Past Month at Unknown time  . azelastine (ASTELIN) 0.1 % nasal spray Place 1 spray into both nostrils 2 (two) times daily. Use in each nostril as directed   Past Month at Unknown time  . clotrimazole-betamethasone (LOTRISONE) lotion Apply topically 2 (two) times daily.   Past Week at Unknown time  . Fluticasone-Salmeterol (ADVAIR) 250-50 MCG/DOSE AEPB Inhale 1 puff into the lungs 2 (two) times daily.   03/08/2019 at Unknown time  . gabapentin (NEURONTIN) 100 MG capsule Take 100 mg by mouth 3 (three) times daily.   03/08/2019 at Unknown time  . montelukast (SINGULAIR) 10 MG tablet Take 10 mg by mouth at bedtime.   03/08/2019 at Unknown time  . pantoprazole (PROTONIX) 40 MG tablet Take 40 mg by mouth daily.   03/08/2019 at Unknown time  . potassium chloride (K-DUR,KLOR-CON) 10 MEQ tablet Take 10 mEq by mouth daily.   03/08/2019 at Unknown time  . quinapril-hydrochlorothiazide (ACCURETIC) 20-25 MG tablet Take 1 tablet by mouth daily.   03/09/2019 at Unknown time  . tiZANidine (ZANAFLEX) 4 MG tablet Take 4 mg by mouth every 6 (six) hours as needed for  muscle spasms.   Completed Course at Unknown time     Allergies  Allergen Reactions  . Atorvastatin     unknown  . Penicillins     Has patient had a PCN reaction causing immediate rash, facial/tongue/throat swelling, SOB or lightheadedness with hypotension: unknown Has patient had a PCN reaction causing severe rash involving mucus membranes or skin necrosis: unknown Has patient had a PCN reaction that required hospitalization unknown Has patient had a PCN reaction occurring within the last 10 years: unknown If all of the above answers are "NO", then may proceed with Cephalosporin use.   . Sulfa Antibiotics Itching  . Zetia [Ezetimibe]     unknown     Past Medical History:  Diagnosis Date  . Asthma   . Coronary artery disease   . Esophageal dysphagia   . GERD (gastroesophageal reflux disease)    occasional  . Hyperlipidemia   . Hypertension     Review of systems:  Otherwise negative.    Physical Exam  Gen: Alert, oriented. Appears stated age.  HEENT: [place]/AT. PERRLA. Lungs: CTA, no wheezes. CV: RR nl S1, S2. Abd: soft, benign, no masses. BS+ Ext: No edema. Pulses 2+    Planned procedures: Proceed with EGD. The patient understands the nature of the planned procedure, indications, risks, alternatives and potential complications including but not limited to bleeding, infection, perforation, damage to internal organs and possible oversedation/side effects from anesthesia. The patient agrees and gives consent to proceed.  Please refer to procedure notes for findings, recommendations and patient disposition/instructions.     Wade Asebedo K. Alice Reichert, M.D. Gastroenterology 03/09/2019  9:58 AM

## 2019-03-09 NOTE — Transfer of Care (Signed)
Immediate Anesthesia Transfer of Care Note  Patient: Melissa Alvarado  Procedure(s) Performed: ESOPHAGOGASTRODUODENOSCOPY (EGD) WITH PROPOFOL (N/A )  Patient Location: PACU  Anesthesia Type:General  Level of Consciousness: awake, alert  and oriented  Airway & Oxygen Therapy: Patient Spontanous Breathing and Patient connected to nasal cannula oxygen  Post-op Assessment: Report given to RN and Post -op Vital signs reviewed and stable  Post vital signs: Reviewed and stable  Last Vitals:  Vitals Value Taken Time  BP 143/66 03/09/19 1050  Temp 36.1 C 03/09/19 1050  Pulse 69 03/09/19 1055  Resp 15 03/09/19 1055  SpO2 100 % 03/09/19 1055  Vitals shown include unvalidated device data.  Last Pain:  Vitals:   03/09/19 1050  TempSrc: Temporal  PainSc:          Complications: No apparent anesthesia complications

## 2019-03-09 NOTE — Anesthesia Preprocedure Evaluation (Addendum)
Anesthesia Evaluation  Patient identified by MRN, date of birth, ID band Patient awake    Reviewed: Allergy & Precautions, NPO status , Patient's Chart, lab work & pertinent test results  History of Anesthesia Complications Negative for: history of anesthetic complications  Airway Mallampati: II  TM Distance: >3 FB Neck ROM: Full    Dental  (+) Edentulous Upper, Missing   Pulmonary asthma ,    breath sounds clear to auscultation- rhonchi (-) wheezing      Cardiovascular hypertension, Pt. on medications (-) CAD, (-) Past MI, (-) Cardiac Stents and (-) CABG  Rhythm:Regular Rate:Normal - Systolic murmurs and - Diastolic murmurs    Neuro/Psych neg Seizures negative neurological ROS  negative psych ROS   GI/Hepatic Neg liver ROS, GERD  ,  Endo/Other  negative endocrine ROSneg diabetes  Renal/GU negative Renal ROS     Musculoskeletal negative musculoskeletal ROS (+)   Abdominal (+) - obese,   Peds  Hematology negative hematology ROS (+)   Anesthesia Other Findings Past Medical History: No date: Asthma No date: Coronary artery disease No date: Esophageal dysphagia No date: GERD (gastroesophageal reflux disease)     Comment:  occasional No date: Hyperlipidemia No date: Hypertension   Reproductive/Obstetrics                             Anesthesia Physical Anesthesia Plan  ASA: II  Anesthesia Plan: General   Post-op Pain Management:    Induction: Intravenous  PONV Risk Score and Plan: 2 and Propofol infusion  Airway Management Planned: Natural Airway  Additional Equipment:   Intra-op Plan:   Post-operative Plan:   Informed Consent: I have reviewed the patients History and Physical, chart, labs and discussed the procedure including the risks, benefits and alternatives for the proposed anesthesia with the patient or authorized representative who has indicated his/her understanding  and acceptance.     Dental advisory given  Plan Discussed with: CRNA and Anesthesiologist  Anesthesia Plan Comments:        Anesthesia Quick Evaluation

## 2019-03-09 NOTE — Op Note (Signed)
[facility] Gastroenterology Patient Name: Melissa Alvarado Procedure Date: 03/09/2019 10:36 AM MRN: [id number] Account #: [id number] Date of Birth: 05/20/1940 Admit Type: Outpatient Age: 79 Room: [facility] ENDO ROOM 3 Gender: Female Note Status: Finalized Procedure:             Upper GI endoscopy Indications:           Esophageal dysphagia, Suspected esophageal reflux Providers:             Benay Pike. Alice Reichert MD, MD Referring MD:          Biagio Borg, MD (Referring MD) Medicines:             Propofol per Anesthesia Complications:         No immediate complications. Procedure:             Pre-Anesthesia Assessment:                        - The risks and benefits of the procedure and the                         sedation options and risks were discussed with the                         patient. All questions were answered and informed                         consent was obtained.                        - Patient identification and proposed procedure were                         verified prior to the procedure by the nurse. The                         procedure was verified in the procedure room.                        - ASA Grade Assessment: III - A patient with severe                         systemic disease.                        - After reviewing the risks and benefits, the patient                         was deemed in satisfactory condition to undergo the                         procedure.                        After obtaining informed consent, the endoscope was                         passed under direct vision. Throughout the procedure,                         the patient's  blood pressure, pulse, and oxygen                         saturations were monitored continuously. The Endoscope                         was introduced through the mouth, and advanced to the                         third part of duodenum. The upper GI endoscopy was   accomplished without difficulty. The patient tolerated                         the procedure well. Findings:      A single 5 mm mucosal nodule with a localized distribution was found in       the distal esophagus. Biopsies were taken with a cold forceps for       histology.      One benign-appearing, intrinsic mild stenosis was found at the       gastroesophageal junction. This stenosis measured 1.5 cm (inner       diameter) x less than one cm (in length). The stenosis was traversed.       The scope was withdrawn. Dilation was performed with a Maloney dilator       with no resistance at 39 Fr.      A 1 cm hiatal hernia was present.      The examined duodenum was normal.      The exam was otherwise without abnormality. Impression:            - Mucosal nodule found in the esophagus. Biopsied.                        - Benign-appearing esophageal stenosis. Dilated.                        - 1 cm hiatal hernia.                        - Normal examined duodenum.                        - The examination was otherwise normal. Recommendation:        - Patient has a contact number available for                         emergencies. The signs and symptoms of potential                         delayed complications were discussed with the patient.                         Return to normal activities tomorrow. Written                         discharge instructions were provided to the patient.                        - Resume previous diet.                        -  Continue present medications.                        - Await pathology results.                        - Monitor results to esophageal dilation                        - Use Prilosec (omeprazole) 20 mg PO daily.                        - Return to physician assistant in 6 weeks.                        - The findings and recommendations were discussed with                         the patient. Procedure Code(s):     --- Professional ---                         [phone], Esophagogastroduodenoscopy, flexible,                         transoral; with biopsy, single or multiple                        43450, Dilation of esophagus, by unguided sound or                         bougie, single or multiple passes Diagnosis Code(s):     --- Professional ---                        R13.14, Dysphagia, pharyngoesophageal phase                        K44.9, Diaphragmatic hernia without obstruction or                         gangrene                        K22.2, Esophageal obstruction                        K22.8, Other specified diseases of esophagus CPT copyright 2019 [employer]. All rights reserved. The codes documented in this report are preliminary and upon coder review may  be revised to meet current compliance requirements. Efrain Sella MD, MD 03/09/2019 10:52:37 AM This report has been signed electronically. Number of Addenda: 0 Note Initiated On: 03/09/2019 10:36 AM Estimated Blood Loss:  Estimated blood loss: none.      [facility]

## 2019-03-10 ENCOUNTER — Encounter: Payer: Self-pay | Admitting: *Deleted

## 2019-03-10 LAB — SURGICAL PATHOLOGY

## 2019-03-11 NOTE — Anesthesia Postprocedure Evaluation (Signed)
Anesthesia Post Note  Patient: Melissa Alvarado  Procedure(s) Performed: ESOPHAGOGASTRODUODENOSCOPY (EGD) WITH PROPOFOL (N/A )  Patient location during evaluation: Endoscopy Anesthesia Type: General Level of consciousness: awake and alert and oriented Pain management: pain level controlled Vital Signs Assessment: post-procedure vital signs reviewed and stable Respiratory status: spontaneous breathing, nonlabored ventilation and respiratory function stable Cardiovascular status: blood pressure returned to baseline and stable Postop Assessment: no signs of nausea or vomiting Anesthetic complications: no     Last Vitals:  Vitals:   03/09/19 1110 03/09/19 1120  BP: (!) 156/79 (!) 161/68  Pulse: 71 72  Resp: 11 18  Temp:    SpO2: 100% 100%    Last Pain:  Vitals:   03/10/19 0749  TempSrc:   PainSc: 0-No pain                 Dealva Lafoy

## 2019-11-07 ENCOUNTER — Encounter: Payer: Self-pay | Admitting: Intensive Care

## 2019-11-07 ENCOUNTER — Emergency Department: Payer: Medicare PPO

## 2019-11-07 ENCOUNTER — Emergency Department
Admission: EM | Admit: 2019-11-07 | Discharge: 2019-11-07 | Disposition: A | Discharge status: Home / Self Care | Payer: Medicare PPO | Attending: Emergency Medicine | Admitting: Emergency Medicine

## 2019-11-07 ENCOUNTER — Other Ambulatory Visit: Payer: Self-pay

## 2019-11-07 DIAGNOSIS — I10 Essential (primary) hypertension: Secondary | ICD-10-CM | POA: Diagnosis not present

## 2019-11-07 DIAGNOSIS — M75102 Unspecified rotator cuff tear or rupture of left shoulder, not specified as traumatic: Secondary | ICD-10-CM | POA: Diagnosis not present

## 2019-11-07 DIAGNOSIS — M7582 Other shoulder lesions, left shoulder: Secondary | ICD-10-CM

## 2019-11-07 DIAGNOSIS — J45909 Unspecified asthma, uncomplicated: Secondary | ICD-10-CM | POA: Insufficient documentation

## 2019-11-07 DIAGNOSIS — Z955 Presence of coronary angioplasty implant and graft: Secondary | ICD-10-CM | POA: Insufficient documentation

## 2019-11-07 DIAGNOSIS — Z7951 Long term (current) use of inhaled steroids: Secondary | ICD-10-CM | POA: Insufficient documentation

## 2019-11-07 DIAGNOSIS — Z7952 Long term (current) use of systemic steroids: Secondary | ICD-10-CM | POA: Insufficient documentation

## 2019-11-07 DIAGNOSIS — I2510 Atherosclerotic heart disease of native coronary artery without angina pectoris: Secondary | ICD-10-CM | POA: Insufficient documentation

## 2019-11-07 DIAGNOSIS — Z79899 Other long term (current) drug therapy: Secondary | ICD-10-CM | POA: Diagnosis not present

## 2019-11-07 MED ORDER — HYDROCODONE-ACETAMINOPHEN 5-325 MG PO TABS
1.0000 | ORAL_TABLET | Freq: Once | ORAL | Status: AC
  Administered 2019-11-07: 1 via ORAL
  Filled 2019-11-07: qty 1

## 2019-11-07 MED ORDER — PREDNISONE 20 MG PO TABS
60.0000 mg | ORAL_TABLET | Freq: Once | ORAL | Status: AC
  Administered 2019-11-07: 60 mg via ORAL
  Filled 2019-11-07: qty 3

## 2019-11-07 MED ORDER — PREDNISONE 10 MG PO TABS: 10.0000 mg | ORAL_TABLET | Freq: Every day | ORAL | 0 refills | Status: DC

## 2019-11-07 MED ORDER — HYDROCODONE-ACETAMINOPHEN 5-325 MG PO TABS: 1.0000 | ORAL_TABLET | ORAL | 0 refills | Status: DC | PRN

## 2019-11-07 NOTE — ED Triage Notes (Signed)
C/o left shoulder/shoulder blade pain X3 weeks. Denies chest pain

## 2019-11-07 NOTE — ED Notes (Signed)
Pt wheeled to lobby to wait for husband

## 2019-11-07 NOTE — ED Provider Notes (Signed)
[facility] Emergency Department Provider Note  ____________________________________________  Time seen: Approximately 12:18 PM  I have reviewed the triage vital signs and the nursing notes.   HISTORY  Chief Complaint Shoulder Pain    HPI Melissa Alvarado is a 79 y.o. female who presents the emergency department complaining of left superior shoulder pain.  Patient states that she has a history of shoulder issues.  Over the past several weeks this has been worsening.  She states that there is a catching/clicking sensation with movement of the shoulder.  The pain is reproducible with palpation along the superior aspect of the shoulder over the rotator cuff.  There was no specific injury.  No numbness and tingling radiating down the left arm.  Patient with no chest pain, shortness of breath.  Triage note noted pain between the shoulder blades, patient is not having any pain to the posterior or anterior aspect of the shoulder.  No medications for his complaint prior to arrival.  Medical history includes asthma, coronary artery disease, GERD, hyperlipidemia and hypertension.  No complaints of chronic medical issues.         Past Medical History:  Diagnosis Date  . Asthma   . Coronary artery disease   . Esophageal dysphagia   . GERD (gastroesophageal reflux disease)    occasional  . Hyperlipidemia   . Hypertension     There are no problems to display for this patient.   Past Surgical History:  Procedure Laterality Date  . CARDIAC CATHETERIZATION    . CATARACT EXTRACTION    . CHOLECYSTECTOMY    . COLONOSCOPY WITH PROPOFOL N/A 07/04/2015   Procedure: COLONOSCOPY WITH PROPOFOL;  Surgeon: Josefine Class, MD;  Location: [facility] ENDOSCOPY;  Service: Endoscopy;  Laterality: N/A;  . CORONARY ANGIOPLASTY    . DILATION AND CURETTAGE, DIAGNOSTIC / THERAPEUTIC    . ESOPHAGOGASTRODUODENOSCOPY (EGD) WITH PROPOFOL N/A 03/09/2019   Procedure: ESOPHAGOGASTRODUODENOSCOPY (EGD)  WITH PROPOFOL;  Surgeon: Toledo, Benay Pike, MD;  Location: [facility] ENDOSCOPY;  Service: Gastroenterology;  Laterality: N/A;  . HYSTEROSCOPY WITH D & C N/A 08/26/2015   Procedure: DILATATION AND CURETTAGE /HYSTEROSCOPY;  Surgeon: Lorette Ang, MD;  Location: [facility] ORS;  Service: Gynecology;  Laterality: N/A;  . TUBAL LIGATION      Prior to Admission medications   Medication Sig Start Date End Date Taking? Authorizing Provider  albuterol (ACCUNEB) 1.25 MG/3ML nebulizer solution Take 1 ampule by nebulization every 6 (six) hours as needed for wheezing.    [provider]  albuterol (PROAIR HFA) 108 (90 Base) MCG/ACT inhaler Inhale 2 puffs into the lungs every 4 (four) hours as needed.    [provider]  azelastine (ASTELIN) 0.1 % nasal spray Place 1 spray into both nostrils 2 (two) times daily. Use in each nostril as directed    [provider]  clotrimazole-betamethasone (LOTRISONE) lotion Apply topically 2 (two) times daily.    [provider]  Fluticasone-Salmeterol (ADVAIR) 250-50 MCG/DOSE AEPB Inhale 1 puff into the lungs 2 (two) times daily.    [provider]  gabapentin (NEURONTIN) 100 MG capsule Take 100 mg by mouth 3 (three) times daily.    [provider]  HYDROcodone-acetaminophen (NORCO/VICODIN) 5-325 MG tablet Take 1 tablet by mouth every 4 (four) hours as needed for moderate pain. 11/07/19   [name], [name], PA-C  montelukast (SINGULAIR) 10 MG tablet Take 10 mg by mouth at bedtime.    [provider]  pantoprazole (PROTONIX) 40 MG tablet Take  40 mg by mouth daily.    [provider]  potassium chloride (K-DUR,KLOR-CON) 10 MEQ tablet Take 10 mEq by mouth daily.    [provider]  predniSONE (DELTASONE) 10 MG tablet Take 1 tablet (10 mg total) by mouth daily. 11/07/19   [name], [name], PA-C  quinapril-hydrochlorothiazide (ACCURETIC) 20-25 MG tablet Take 1 tablet by mouth daily.    [provider]  tiZANidine (ZANAFLEX) 4 MG tablet Take 4 mg by mouth every 6 (six) hours as needed for muscle spasms.    [provider]    Allergies Atorvastatin, Penicillins, Sulfa antibiotics, and Zetia [ezetimibe]  Family History  Problem Relation Age of Onset  . Breast cancer Sister     Social History Social History   Tobacco Use  . Smoking status: Never Smoker  . Smokeless tobacco: Never Used  Vaping Use  . Vaping Use: Never used  Substance Use Topics  . Alcohol use: No  . Drug use: No     Review of Systems  Constitutional: No fever/chills Eyes: No visual changes. No discharge ENT: No upper respiratory complaints. Cardiovascular: no chest pain. Respiratory: no cough. No SOB. Gastrointestinal: No abdominal pain.  No nausea, no vomiting.  No diarrhea.  No constipation. Musculoskeletal: Left shoulder pain Skin: Negative for rash, abrasions, lacerations, ecchymosis. Neurological: Negative for headaches, focal weakness or numbness. 10-point ROS otherwise negative.  ____________________________________________   PHYSICAL EXAM:  VITAL SIGNS: ED Triage Vitals  Enc Vitals Group     BP 11/07/19 1143 133/76     Pulse Rate 11/07/19 1143 66     Resp 11/07/19 1143 18     Temp 11/07/19 1143 98 F (36.7 C)     Temp Source 11/07/19 1143 Oral     SpO2 11/07/19 1143 100 %     Weight 11/07/19 1144 170 lb (77.1 kg)     Height 11/07/19 1144 5\' 7"  (1.702 m)     Head Circumference --      Peak Flow --      Pain Score 11/07/19 1205 9     Pain Loc --      Pain Edu? --      Excl. in [place]? --      Constitutional: Alert and oriented. Well appearing and in no acute distress. Eyes: Conjunctivae are normal. PERRL. EOMI. Head: Atraumatic. ENT:      Ears:       Nose: No congestion/rhinnorhea.      Mouth/Throat: Mucous membranes are moist.  Neck: No stridor.  No cervical spine tenderness to palpation.  Cardiovascular: Normal rate, regular rhythm. Normal S1 and S2.   Good peripheral circulation. Respiratory: Normal respiratory effort without tachypnea or retractions. Lungs CTAB. Good air entry to the bases with no decreased or absent breath sounds. Musculoskeletal: Full range of motion to all extremities. No gross deformities appreciated.  Visualization of the left shoulder reveals no deformity.  Good range of motion.  Patient is tender to palpation along the superior aspect of the shoulder over the diffuse rotator cuff.  No palpable abnormality or deficit.  There is no extension of tenderness into the anterior posterior aspect of the shoulder.  No tenderness over the lateral shoulder joint or acromioclavicular joint space.  Pain is reproducible with palpation over the musculature.  No extension into the cervical spine. Neurologic:  Normal speech and language. No gross focal neurologic deficits are appreciated.  Skin:  Skin is warm, dry and intact. No rash noted. Psychiatric: Mood and affect  are normal. Speech and behavior are normal. Patient exhibits appropriate insight and judgement.   ____________________________________________   LABS (all labs ordered are listed, but only abnormal results are displayed)  Labs Reviewed - No data to display ____________________________________________  EKG  ED ECG REPORT I, [name] Kimesha Claxton,  personally viewed and interpreted this ECG.   Date: 11/07/2019  EKG Time: 1133 hrs.  Rate: 61 bpm  Rhythm: normal sinus rhythm, compared to previous EKG, T wave inversion is no longer present  Axis: Normal axis  Intervals:none  ST&T Change: No ST elevation or depression noted  Normal sinus rhythm.  No STEMI.  Normal EKG.   ____________________________________________  RADIOLOGY I personally viewed and evaluated these images as part of my medical decision making, as well as reviewing the written report by the radiologist.  DG Shoulder Left  Result Date: 11/07/2019 CLINICAL DATA:  Acute on chronic LEFT shoulder  pain. Pain worsening over the past 3 weeks. EXAM: LEFT SHOULDER - 2+ VIEW COMPARISON:  None. FINDINGS: Osseous alignment is normal. No acute or suspicious osseous finding. No fracture line or displaced fracture fragment. No significant degenerative changes seen at the glenohumeral or acromioclavicular joint space. 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 IMPRESSION: 1. No acute findings. 2. Calcific density at the superior margin of the humeral head, presumably chronic calcific tendinopathy of the overlying rotator cuff complex. Electronically Signed   By: Franki Cabot M.D.   On: 11/07/2019 13:07    ____________________________________________    PROCEDURES  Procedure(s) performed:    Procedures    Medications  HYDROcodone-acetaminophen (NORCO/VICODIN) 5-325 MG per tablet 1 tablet (has no administration in time range)  predniSONE (DELTASONE) tablet 60 mg (has no administration in time range)     ____________________________________________   INITIAL IMPRESSION / ASSESSMENT AND PLAN / ED COURSE  Pertinent labs & imaging results that were available during my care of the patient were reviewed by me and considered in my medical decision making (see chart for details).  Review of the [place] CSRS was performed in accordance of the [place] prior to dispensing any controlled drugs.           Patient's diagnosis is consistent with rotator cuff tendinitis.  Patient presented to the emergency department complaining of pain to the left superior aspect of the shoulder.  Differential included ACS/STEMI, referred pain, cervical radiculopathy, rotator cuff tear, fracture, dislocation.  I had a low suspicion for cardiac origin as this was reproducible in the superior aspect of the shoulder with no radiation.  Pain was worsened with movement.  No chest pain or shortness of breath.  Imaging findings  calcific deposits consistent with chronic rotator cuff tendinitis.  Patient we placed on steroid taper and limited pain medication for symptom relief.  Follow-up with orthopedics..  No indication for further work-up at this time.  Patient is given ED precautions to return to the ED for any worsening or new symptoms.     ____________________________________________  FINAL CLINICAL IMPRESSION(S) / ED DIAGNOSES  Final diagnoses:  Tendinitis of left rotator cuff      NEW MEDICATIONS STARTED DURING THIS VISIT:  ED Discharge Orders         Ordered    predniSONE (DELTASONE) 10 MG tablet  Daily       Note to Pharmacy: Take 6 pills x 2 days, 5 pills x 2 days, 4 pills x 2 days, 3 pills x  2 days, 2 pills x 2 days, and 1 pill x 2 days   11/07/19 1353    HYDROcodone-acetaminophen (NORCO/VICODIN) 5-325 MG tablet  Every 4 hours PRN        11/07/19 1353              This chart was dictated using voice recognition software/Dragon. Despite best efforts to proofread, errors can occur which can change the meaning. Any change was purely unintentional.    Darletta Moll, PA-C 11/07/19 1355    Lucrezia Starch, MD 11/07/19 1515

## 2019-11-18 ENCOUNTER — Other Ambulatory Visit (HOSPITAL_COMMUNITY): Payer: Self-pay | Admitting: Orthopedic Surgery

## 2019-11-18 ENCOUNTER — Other Ambulatory Visit: Payer: Self-pay | Admitting: Orthopedic Surgery

## 2019-11-18 DIAGNOSIS — M25312 Other instability, left shoulder: Secondary | ICD-10-CM

## 2019-11-18 DIAGNOSIS — M25512 Pain in left shoulder: Secondary | ICD-10-CM

## 2019-11-18 DIAGNOSIS — M7532 Calcific tendinitis of left shoulder: Secondary | ICD-10-CM

## 2019-11-21 ENCOUNTER — Other Ambulatory Visit: Payer: Self-pay

## 2019-11-21 ENCOUNTER — Ambulatory Visit
Admission: RE | Admit: 2019-11-21 | Discharge: 2019-11-21 | Disposition: A | Discharge status: Home / Self Care | Payer: Medicare PPO | Source: Ambulatory Visit | Attending: Orthopedic Surgery | Admitting: Orthopedic Surgery

## 2019-11-21 DIAGNOSIS — M25312 Other instability, left shoulder: Secondary | ICD-10-CM | POA: Diagnosis present

## 2019-11-21 DIAGNOSIS — M25512 Pain in left shoulder: Secondary | ICD-10-CM | POA: Insufficient documentation

## 2019-11-21 DIAGNOSIS — M7532 Calcific tendinitis of left shoulder: Secondary | ICD-10-CM | POA: Diagnosis present

## 2019-11-21 DIAGNOSIS — G8929 Other chronic pain: Secondary | ICD-10-CM | POA: Diagnosis present

## 2021-09-09 IMAGING — MR MR SHOULDER*L* W/O CM
5 series · 38 of 40 positions shown · non-contrast
Comparison: None.

CLINICAL DATA: Left shoulder pain, limited range of motion for 4
months

EXAM:
MRI OF THE LEFT SHOULDER WITHOUT CONTRAST
TECHNIQUE: Multiplanar, multisequence MR imaging of the shoulder was performed.
No intravenous contrast was administered.

[Series 3: PD fat-sat · axial · left · 4.0mm · 0.55mm/px · z∈[-71,+59]mm · 9 of 28 slices shown (1 of 2)]
[im 1/28]
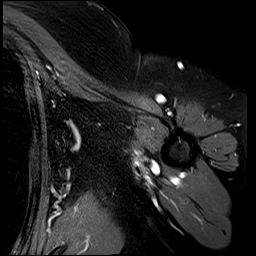
[im 4/28]
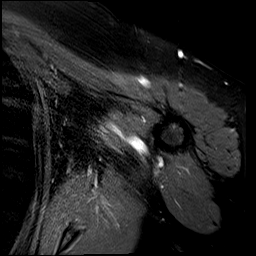
[im 7/28]
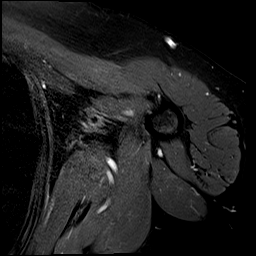
[im 11/28]
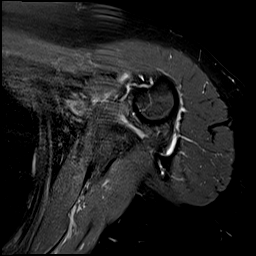
[im 14/28]
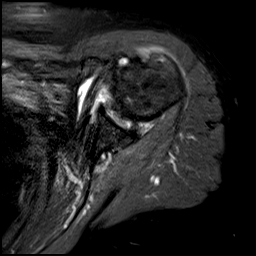
[im 17/28]
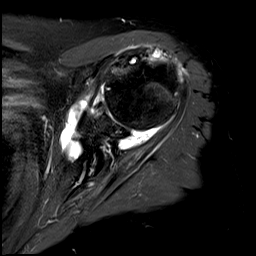
[im 21/28]
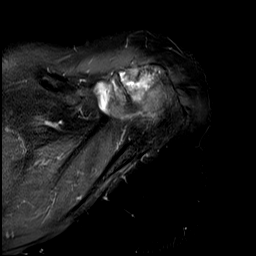
[im 24/28]
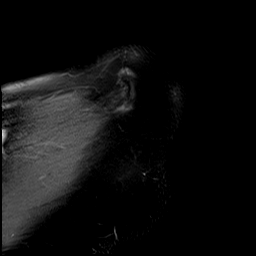
[im 28/28]
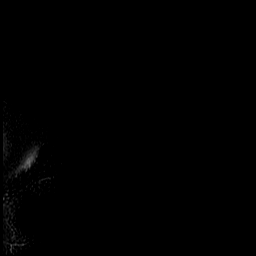

[Series 4: PD fat-sat · oblique · left · 4.0mm · 0.44mm/px · 8 of 22 slices shown (2 of 2)]
[im 1/22]
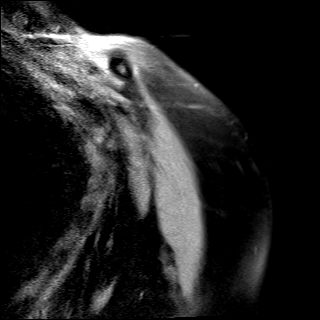
[im 4/22]
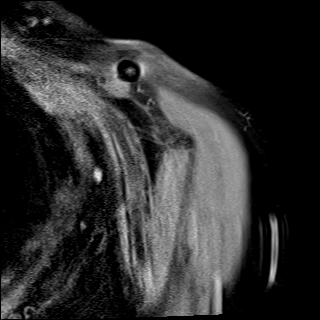
[im 7/22]
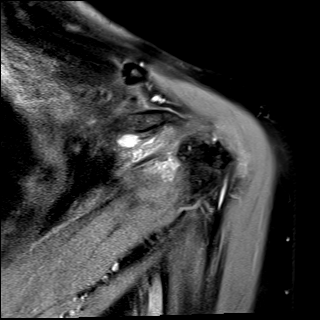
[im 10/22]
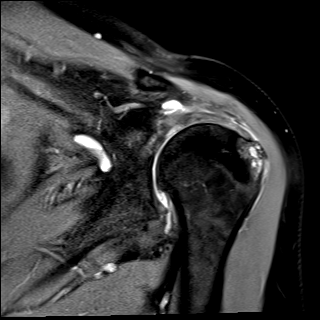
[im 13/22]
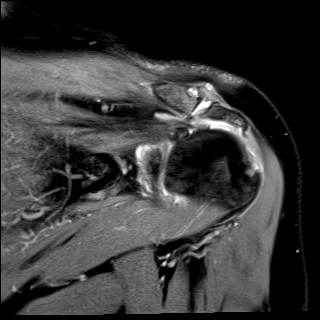
[im 16/22]
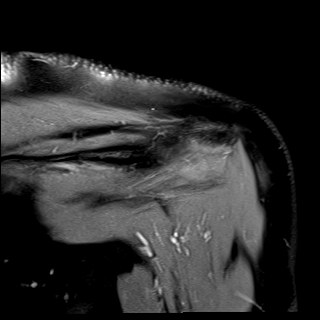
[im 19/22]
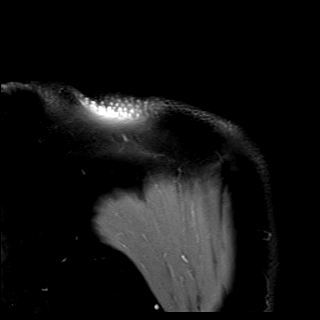
[im 22/22]
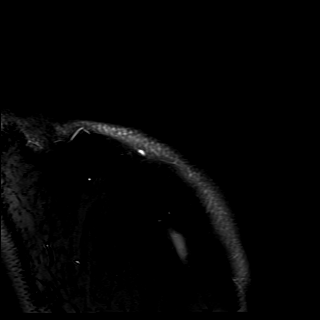

[Series 5: T2 fat-sat · oblique · left · 4.0mm · 0.44mm/px · 8 of 22 slices shown (1 of 2)]
[im 1/22]
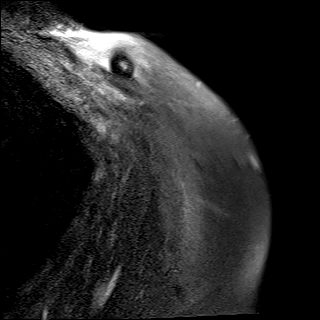
[im 4/22]
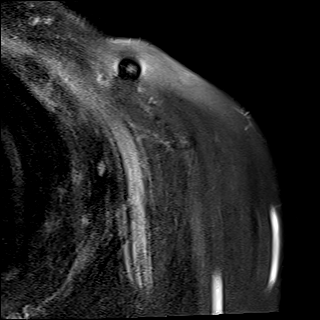
[im 7/22]
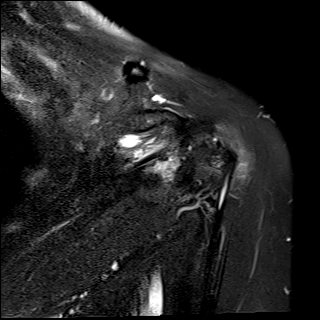
[im 10/22]
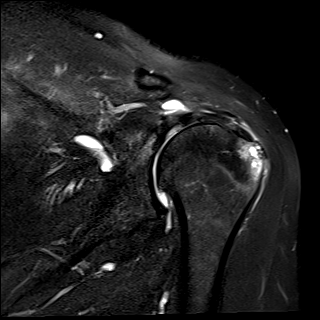
[im 13/22]
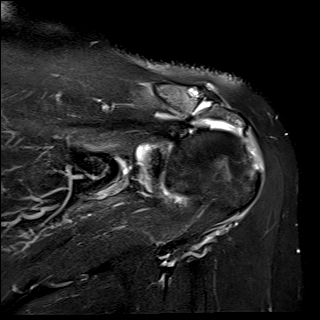
[im 16/22]
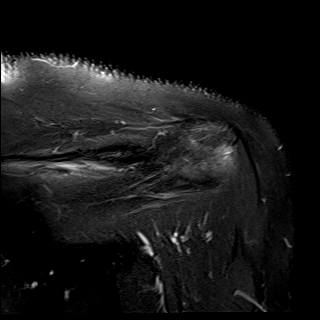
[im 19/22]
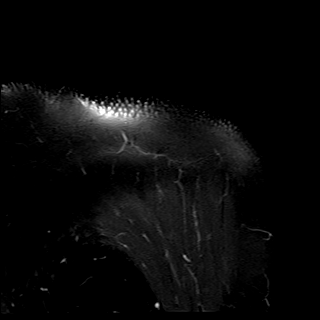
[im 22/22]
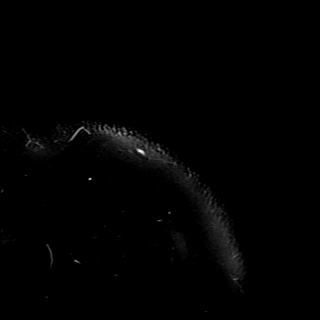

[Series 6: T2 fat-sat · oblique · left · 4.0mm · 0.27mm/px · 7 of 21 slices shown (2 of 2)]
[im 1/21]
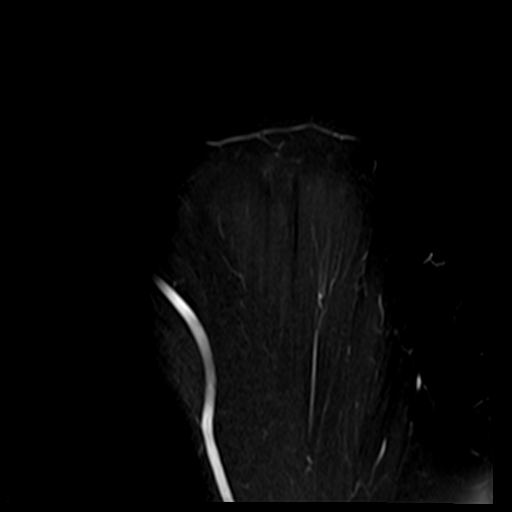
[im 4/21]
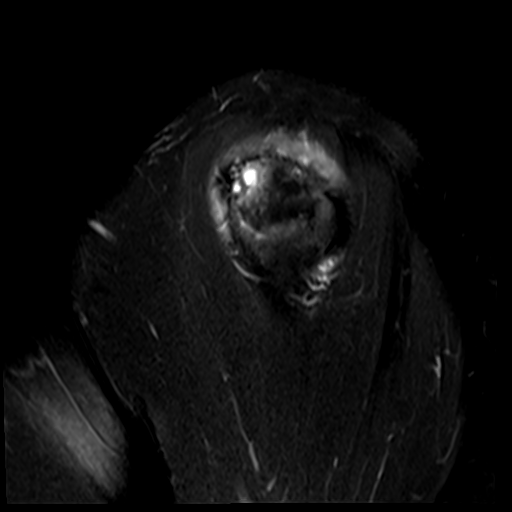
[im 7/21]
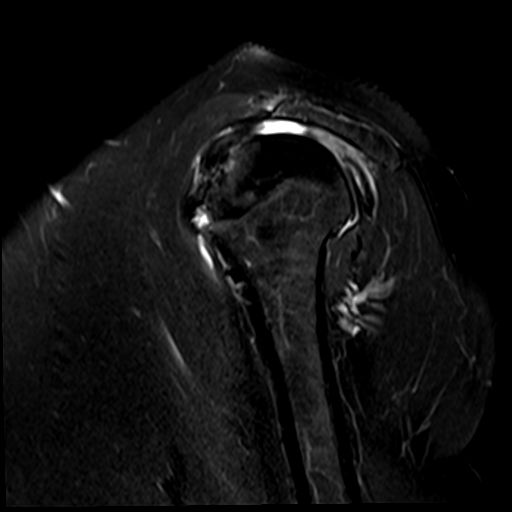
[im 11/21]
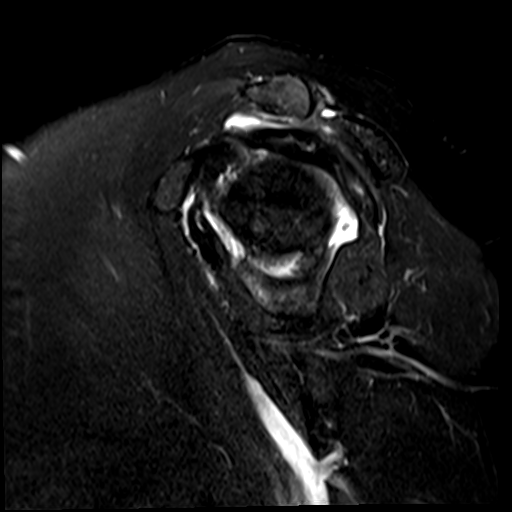
[im 14/21]
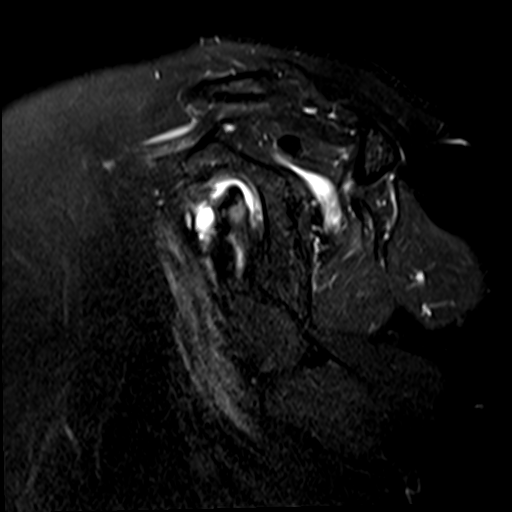
[im 17/21]
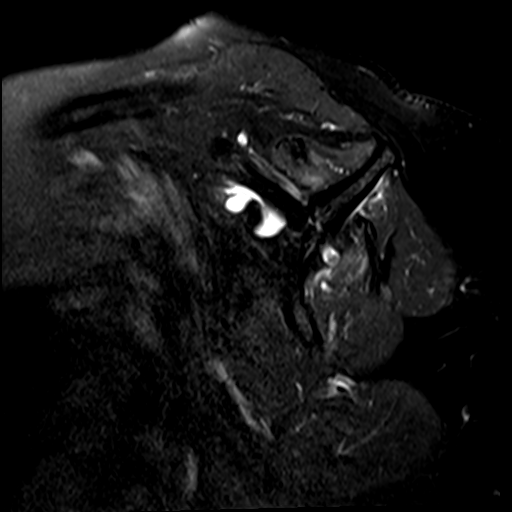
[im 21/21]
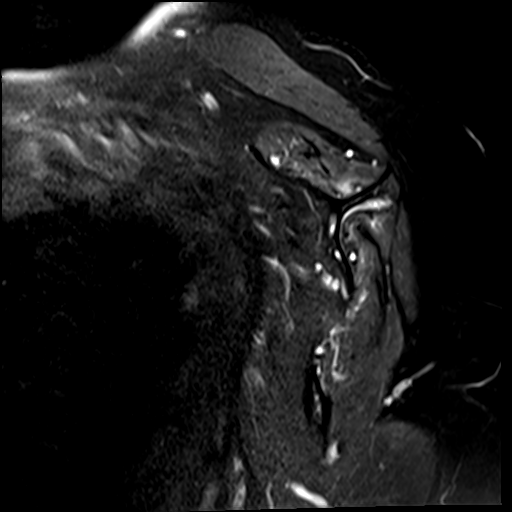

[Series 7: T1 · oblique · left · 4.0mm · 0.44mm/px · 6 of 22 slices shown]
[im 1/22]
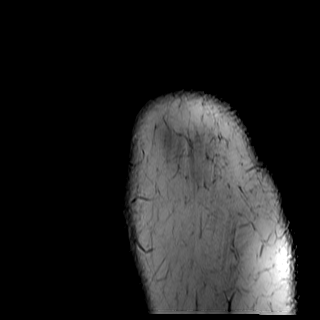
[im 4/22]
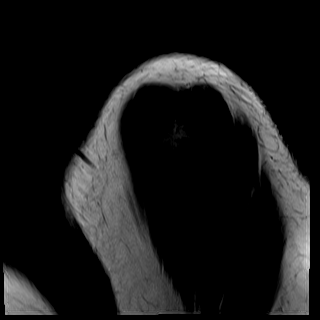
[im 7/22]
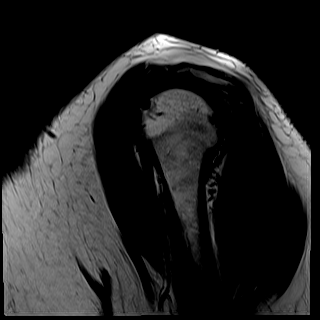
[im 10/22]
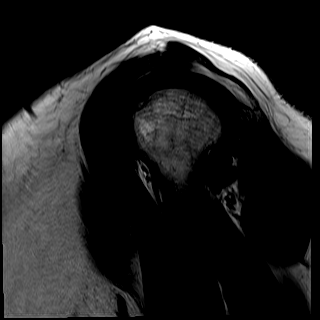
[im 13/22]
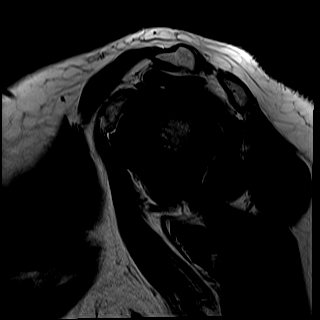
[im 16/22]
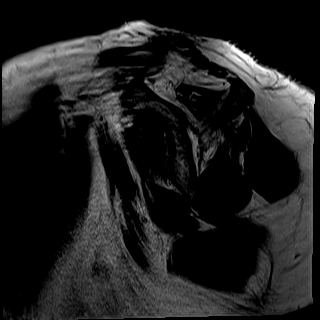

[38 of 40 positions shown; findings below may reference images not displayed]

FINDINGS: Rotator cuff: Large full-thickness, near complete, tear of the
supraspinatus tendon with a few intact anterior fibers and 2.6 cm of
retraction. Severe tendinosis of the infraspinatus tendon. Teres
minor tendon is intact. Subscapularis tendon is intact.

Muscles: Mild muscle atrophy of the supraspinatus, infraspinatus and
to a greater degree subscapularis muscles. Remainder the muscles
demonstrate no focal abnormality.

Biceps Long Head: Moderate tendinosis of the intra-articular portion
of the long head of the biceps tendon.

Acromioclavicular Joint: Severe arthropathy of the acromioclavicular
joint. Type II acromion. No subacromial/subdeltoid bursal fluid.

Glenohumeral Joint: Small joint effusion. High-grade
partial-thickness cartilage loss with areas of full-thickness
cartilage loss of the glenohumeral joint.

Labrum: Superior labral degeneration.

Bones: No fracture or dislocation. No aggressive osseous lesion.
Subcortical reactive marrow changes at the supraspinatus and
infraspinatus insertion.

Other: No fluid collection or hematoma.
IMPRESSION: 1. Large full-thickness, near complete, tear of the supraspinatus
tendon with a few intact anterior fibers and 2.6 cm of retraction.
2. Severe tendinosis of the infraspinatus tendon.
3. Moderate tendinosis of the intra-articular portion of the long
head of the biceps tendon.
4. Moderate-severe osteoarthritis of the left glenohumeral joint.

## 2021-12-20 ENCOUNTER — Emergency Department: Payer: Medicare HMO

## 2021-12-20 ENCOUNTER — Observation Stay
Admission: EM | Admit: 2021-12-20 | Discharge: 2021-12-22 | Disposition: A | Discharge status: Home / Self Care | Payer: Medicare HMO | Attending: Internal Medicine | Admitting: Internal Medicine

## 2021-12-20 ENCOUNTER — Other Ambulatory Visit: Payer: Self-pay

## 2021-12-20 DIAGNOSIS — N179 Acute kidney failure, unspecified: Secondary | ICD-10-CM | POA: Diagnosis not present

## 2021-12-20 DIAGNOSIS — I2510 Atherosclerotic heart disease of native coronary artery without angina pectoris: Secondary | ICD-10-CM | POA: Diagnosis not present

## 2021-12-20 DIAGNOSIS — E860 Dehydration: Secondary | ICD-10-CM | POA: Insufficient documentation

## 2021-12-20 DIAGNOSIS — Z1152 Encounter for screening for COVID-19: Secondary | ICD-10-CM | POA: Insufficient documentation

## 2021-12-20 DIAGNOSIS — J45909 Unspecified asthma, uncomplicated: Secondary | ICD-10-CM | POA: Insufficient documentation

## 2021-12-20 DIAGNOSIS — I10 Essential (primary) hypertension: Secondary | ICD-10-CM

## 2021-12-20 DIAGNOSIS — R630 Anorexia: Secondary | ICD-10-CM

## 2021-12-20 DIAGNOSIS — R55 Syncope and collapse: Principal | ICD-10-CM

## 2021-12-20 DIAGNOSIS — I341 Nonrheumatic mitral (valve) prolapse: Secondary | ICD-10-CM | POA: Insufficient documentation

## 2021-12-20 DIAGNOSIS — R1013 Epigastric pain: Secondary | ICD-10-CM

## 2021-12-20 DIAGNOSIS — Z955 Presence of coronary angioplasty implant and graft: Secondary | ICD-10-CM | POA: Insufficient documentation

## 2021-12-20 DIAGNOSIS — E785 Hyperlipidemia, unspecified: Secondary | ICD-10-CM

## 2021-12-20 DIAGNOSIS — E876 Hypokalemia: Secondary | ICD-10-CM

## 2021-12-20 DIAGNOSIS — Z79899 Other long term (current) drug therapy: Secondary | ICD-10-CM | POA: Insufficient documentation

## 2021-12-20 DIAGNOSIS — K219 Gastro-esophageal reflux disease without esophagitis: Secondary | ICD-10-CM

## 2021-12-20 DIAGNOSIS — R42 Dizziness and giddiness: Secondary | ICD-10-CM

## 2021-12-20 LAB — CBC
HCT: 33.8 % — ABNORMAL LOW (ref 36.0–46.0)
Hemoglobin: 11 g/dL — ABNORMAL LOW (ref 12.0–15.0)
MCH: 26 pg (ref 26.0–34.0)
MCHC: 32.5 g/dL (ref 30.0–36.0)
MCV: 79.9 fL — ABNORMAL LOW (ref 80.0–100.0)
Platelets: 247 10*3/uL (ref 150–400)
RBC: 4.23 MIL/uL (ref 3.87–5.11)
RDW: 13.8 % (ref 11.5–15.5)
WBC: 7.7 10*3/uL (ref 4.0–10.5)
nRBC: 0 % (ref 0.0–0.2)

## 2021-12-20 LAB — URINALYSIS, ROUTINE W REFLEX MICROSCOPIC
Bacteria, UA: NONE SEEN
Bilirubin Urine: NEGATIVE
Glucose, UA: NEGATIVE mg/dL
Ketones, ur: NEGATIVE mg/dL
Leukocytes,Ua: NEGATIVE
Nitrite: NEGATIVE
Protein, ur: NEGATIVE mg/dL
Specific Gravity, Urine: 1.004 — ABNORMAL LOW (ref 1.005–1.030)
pH: 7 (ref 5.0–8.0)

## 2021-12-20 LAB — BASIC METABOLIC PANEL
Anion gap: 13 (ref 5–15)
BUN: 14 mg/dL (ref 8–23)
CO2: 28 mmol/L (ref 22–32)
Calcium: 9.6 mg/dL (ref 8.9–10.3)
Chloride: 94 mmol/L — ABNORMAL LOW (ref 98–111)
Creatinine, Ser: 1.57 mg/dL — ABNORMAL HIGH (ref 0.44–1.00)
GFR, Estimated: 33 mL/min — ABNORMAL LOW (ref >60)
Glucose, Bld: 141 mg/dL — ABNORMAL HIGH (ref 70–99)
Potassium: 3 mmol/L — ABNORMAL LOW (ref 3.5–5.1)
Sodium: 135 mmol/L (ref 135–145)

## 2021-12-20 LAB — RESP PANEL BY RT-PCR (FLU A&B, COVID) ARPGX2
Influenza A by PCR: NEGATIVE
Influenza B by PCR: NEGATIVE
SARS Coronavirus 2 by RT PCR: NEGATIVE

## 2021-12-20 MED ORDER — POTASSIUM CHLORIDE CRYS ER 20 MEQ PO TBCR
40.0000 meq | EXTENDED_RELEASE_TABLET | Freq: Once | ORAL | Status: AC
  Administered 2021-12-20: 40 meq via ORAL
  Filled 2021-12-20: qty 2

## 2021-12-20 MED ORDER — SODIUM CHLORIDE 0.9 % IV SOLN: Freq: Once | INTRAVENOUS | Status: AC

## 2021-12-20 NOTE — ED Provider Notes (Signed)
[facility] Provider Note    Event Date/Time   First MD Initiated Contact with Patient 12/20/21 1325     (approximate)   History   Dizziness   HPI  Melissa Alvarado is a 81 y.o. female who presents after a near syncopal episode.  Family reports they were at the store looking for Halloween candy and patient was leaned against a counter, she started to slide down the counter, family caught her and lowered her to the ground.  No injuries reported.  She reports feeling somewhat dizzy now.  No headache, no neurodeficits.  Patient has had memory issues over the last several months, this is not new.  No fevers or chills noted.  Decreased p.o. intake noted by family     Physical Exam   Triage Vital Signs: ED Triage Vitals  Enc Vitals Group     BP 12/20/21 1210 (!) 148/60     Pulse Rate 12/20/21 1210 (!) 102     Resp 12/20/21 1210 18     Temp 12/20/21 1210 98 F (36.7 C)     Temp src --      SpO2 12/20/21 1210 100 %     Weight --      Height --      Head Circumference --      Peak Flow --      Pain Score 12/20/21 1209 0     Pain Loc --      Pain Edu? --      Excl. in [place]? --     Most recent vital signs: Vitals:   12/20/21 1210  BP: (!) 148/60  Pulse: (!) 102  Resp: 18  Temp: 98 F (36.7 C)  SpO2: 100%     General: Awake, no distress.  CV:  Good peripheral perfusion.  Resp:  Normal effort. Abd:  No distention.  Other:  HEENT, tongue appears dry, musculoskeletal: Moves all extremities well, no evidence of injury, no vertebral tenderness to palpation, no head injury   ED Results / Procedures / Treatments   Labs (all labs ordered are listed, but only abnormal results are displayed) Labs Reviewed  BASIC METABOLIC PANEL - Abnormal; Notable for the following components:      Result Value   Potassium 3.0 (*)    Chloride 94 (*)    Glucose, Bld 141 (*)    Creatinine, Ser 1.57 (*)    GFR, Estimated 33 (*)    All other components within normal  limits  CBC - Abnormal; Notable for the following components:   Hemoglobin 11.0 (*)    HCT 33.8 (*)    MCV 79.9 (*)    All other components within normal limits  URINALYSIS, ROUTINE W REFLEX MICROSCOPIC  CBG MONITORING, ED     EKG  ED ECG REPORT I, Lavonia Drafts, the attending physician, personally viewed and interpreted this ECG.   Rhythm: normal sinus rhythm QRS Axis: normal Intervals: normal ST/T Wave abnormalities: normal Narrative Interpretation: no evidence of acute ischemia    RADIOLOGY     PROCEDURES:  Critical Care performed:   Procedures   MEDICATIONS ORDERED IN ED: Medications  0.9 %  sodium chloride infusion (has no administration in time range)  potassium chloride SA (KLOR-CON M) CR tablet 40 mEq (has no administration in time range)     IMPRESSION / MDM / ASSESSMENT AND PLAN / ED COURSE  I reviewed the triage vital signs and the nursing notes. Patient's presentation is most consistent  with acute presentation with potential threat to life or bodily function.   Patient overall well-appearing here in the emergency department.  Differential includes near syncopal episode, not consistent with CVA, dehydration, fatigue  She appears dehydrated on exam, lab work notable for mild hypokalemia, will give IV fluids, potassium supplementation, hemoglobin is consistent with prior levels, will send for CT head as well.  We will reevaluate after fluids, anticipate discharge       FINAL CLINICAL IMPRESSION(S) / ED DIAGNOSES   Final diagnoses:  Dehydration     Rx / DC Orders   ED Discharge Orders     None        Note:  This document was prepared using Dragon voice recognition software and may include unintentional dictation errors.   Lavonia Drafts, MD 12/20/21 [phone]

## 2021-12-20 NOTE — ED Notes (Signed)
Pt to ED for syncopal episode today while at store. Was eased to ground by husband who is at bedside. Denies unilateral weakness, chest pain, SOB. Continues to feel slightly dizzy since event. Denies hitting head.

## 2021-12-20 NOTE — ED Triage Notes (Signed)
First Nurse Note;  [place] [facility] [place]. Pt had a near syncopal episode. Pt was lowered to the ground pt did not fall. Pt is A&Ox4 and NAD  171/63 100%  124 CBG 80 HR

## 2021-12-20 NOTE — ED Triage Notes (Signed)
Pt comes with c/o dizziness. 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 Pt also states SOb. Pt denies any CP. Pt denies any N/V/D

## 2021-12-20 NOTE — ED Notes (Signed)
Orthostatic vital signs.  157/79 HR 85 standing, sitting  148/68, HR 77, Standing 117/58, HR 115

## 2021-12-21 ENCOUNTER — Observation Stay: Payer: Medicare HMO

## 2021-12-21 ENCOUNTER — Observation Stay (HOSPITAL_BASED_OUTPATIENT_CLINIC_OR_DEPARTMENT_OTHER)
Admit: 2021-12-21 | Discharge: 2021-12-21 | Disposition: A | Discharge status: Home / Self Care | Payer: Medicare HMO | Attending: Family Medicine | Admitting: Family Medicine

## 2021-12-21 ENCOUNTER — Encounter: Payer: Self-pay | Admitting: Family Medicine

## 2021-12-21 DIAGNOSIS — N179 Acute kidney failure, unspecified: Secondary | ICD-10-CM

## 2021-12-21 DIAGNOSIS — R55 Syncope and collapse: Secondary | ICD-10-CM

## 2021-12-21 DIAGNOSIS — R630 Anorexia: Secondary | ICD-10-CM

## 2021-12-21 DIAGNOSIS — E785 Hyperlipidemia, unspecified: Secondary | ICD-10-CM | POA: Diagnosis not present

## 2021-12-21 DIAGNOSIS — R1013 Epigastric pain: Secondary | ICD-10-CM

## 2021-12-21 DIAGNOSIS — I10 Essential (primary) hypertension: Secondary | ICD-10-CM

## 2021-12-21 DIAGNOSIS — I951 Orthostatic hypotension: Secondary | ICD-10-CM | POA: Insufficient documentation

## 2021-12-21 DIAGNOSIS — E876 Hypokalemia: Secondary | ICD-10-CM | POA: Diagnosis not present

## 2021-12-21 DIAGNOSIS — K219 Gastro-esophageal reflux disease without esophagitis: Secondary | ICD-10-CM

## 2021-12-21 LAB — ECHOCARDIOGRAM COMPLETE
AR max vel: 2.78 cm2
AV Area VTI: 2.76 cm2
AV Area mean vel: 2.54 cm2
AV Mean grad: 6 mmHg
AV Peak grad: 11.3 mmHg
Ao pk vel: 1.68 m/s
Area-P 1/2: 4.29 cm2
Height: 60 in
S' Lateral: 2.6 cm
Weight: 2444.46 oz

## 2021-12-21 LAB — CBC
HCT: 34.4 % — ABNORMAL LOW (ref 36.0–46.0)
Hemoglobin: 11.2 g/dL — ABNORMAL LOW (ref 12.0–15.0)
MCH: 26.3 pg (ref 26.0–34.0)
MCHC: 32.6 g/dL (ref 30.0–36.0)
MCV: 80.8 fL (ref 80.0–100.0)
Platelets: 258 10*3/uL (ref 150–400)
RBC: 4.26 MIL/uL (ref 3.87–5.11)
RDW: 13.6 % (ref 11.5–15.5)
WBC: 8.8 10*3/uL (ref 4.0–10.5)
nRBC: 0 % (ref 0.0–0.2)

## 2021-12-21 LAB — BASIC METABOLIC PANEL
Anion gap: 12 (ref 5–15)
BUN: 13 mg/dL (ref 8–23)
CO2: 26 mmol/L (ref 22–32)
Calcium: 8.9 mg/dL (ref 8.9–10.3)
Chloride: 96 mmol/L — ABNORMAL LOW (ref 98–111)
Creatinine, Ser: 1.17 mg/dL — ABNORMAL HIGH (ref 0.44–1.00)
GFR, Estimated: 47 mL/min — ABNORMAL LOW (ref >60)
Glucose, Bld: 92 mg/dL (ref 70–99)
Potassium: 2.8 mmol/L — ABNORMAL LOW (ref 3.5–5.1)
Sodium: 134 mmol/L — ABNORMAL LOW (ref 135–145)

## 2021-12-21 LAB — POTASSIUM: Potassium: 3.9 mmol/L (ref 3.5–5.1)

## 2021-12-21 LAB — MAGNESIUM: Magnesium: 1.7 mg/dL (ref 1.7–2.4)

## 2021-12-21 MED ORDER — ONDANSETRON HCL 4 MG PO TABS: 4.0000 mg | ORAL_TABLET | Freq: Four times a day (QID) | ORAL | Status: DC | PRN

## 2021-12-21 MED ORDER — SUCRALFATE 1 GM/10ML PO SUSP
1.0000 g | Freq: Three times a day (TID) | ORAL | Status: DC
  Administered 2021-12-21 – 2021-12-22 (×3): 1 g via ORAL
  Filled 2021-12-21 (×3): qty 10

## 2021-12-21 MED ORDER — ALBUTEROL SULFATE 1.25 MG/3ML IN NEBU: 1.0000 | INHALATION_SOLUTION | Freq: Four times a day (QID) | RESPIRATORY_TRACT | Status: DC | PRN

## 2021-12-21 MED ORDER — ACETAMINOPHEN 650 MG RE SUPP: 650.0000 mg | Freq: Four times a day (QID) | RECTAL | Status: DC | PRN

## 2021-12-21 MED ORDER — QUETIAPINE FUMARATE 25 MG PO TABS
25.0000 mg | ORAL_TABLET | Freq: Every day | ORAL | Status: DC
  Administered 2021-12-21: 25 mg via ORAL
  Filled 2021-12-21: qty 1

## 2021-12-21 MED ORDER — SODIUM CHLORIDE 0.9% FLUSH
3.0000 mL | Freq: Two times a day (BID) | INTRAVENOUS | Status: DC
  Administered 2021-12-21 – 2021-12-22 (×3): 3 mL via INTRAVENOUS

## 2021-12-21 MED ORDER — HALOPERIDOL LACTATE 5 MG/ML IJ SOLN
1.0000 mg | Freq: Four times a day (QID) | INTRAMUSCULAR | Status: DC | PRN
  Administered 2021-12-21: 1 mg via INTRAMUSCULAR
  Filled 2021-12-21: qty 1

## 2021-12-21 MED ORDER — GABAPENTIN 100 MG PO CAPS
100.0000 mg | ORAL_CAPSULE | Freq: Three times a day (TID) | ORAL | Status: DC
  Administered 2021-12-21 – 2021-12-22 (×4): 100 mg via ORAL
  Filled 2021-12-21 (×4): qty 1

## 2021-12-21 MED ORDER — POTASSIUM CHLORIDE CRYS ER 20 MEQ PO TBCR: 40.0000 meq | EXTENDED_RELEASE_TABLET | ORAL | Status: DC

## 2021-12-21 MED ORDER — ALBUTEROL SULFATE (2.5 MG/3ML) 0.083% IN NEBU: 2.5000 mg | INHALATION_SOLUTION | RESPIRATORY_TRACT | Status: DC | PRN

## 2021-12-21 MED ORDER — HALOPERIDOL 1 MG PO TABS: 1.0000 mg | ORAL_TABLET | Freq: Four times a day (QID) | ORAL | Status: DC | PRN

## 2021-12-21 MED ORDER — TIZANIDINE HCL 4 MG PO TABS: 4.0000 mg | ORAL_TABLET | Freq: Four times a day (QID) | ORAL | Status: DC | PRN

## 2021-12-21 MED ORDER — MAGNESIUM HYDROXIDE 400 MG/5ML PO SUSP: 30.0000 mL | Freq: Every day | ORAL | Status: DC | PRN

## 2021-12-21 MED ORDER — AZELASTINE HCL 0.1 % NA SOLN: 1.0000 | Freq: Two times a day (BID) | NASAL | Status: DC

## 2021-12-21 MED ORDER — PANTOPRAZOLE SODIUM 40 MG PO TBEC
40.0000 mg | DELAYED_RELEASE_TABLET | Freq: Every day | ORAL | Status: DC
  Administered 2021-12-21 – 2021-12-22 (×2): 40 mg via ORAL
  Filled 2021-12-21 (×2): qty 1

## 2021-12-21 MED ORDER — POTASSIUM CHLORIDE IN NACL 20-0.9 MEQ/L-% IV SOLN
INTRAVENOUS | Status: DC
  Filled 2021-12-21: qty 1000

## 2021-12-21 MED ORDER — ALBUTEROL SULFATE HFA 108 (90 BASE) MCG/ACT IN AERS: 2.0000 | INHALATION_SPRAY | RESPIRATORY_TRACT | Status: DC | PRN

## 2021-12-21 MED ORDER — HYDROCODONE-ACETAMINOPHEN 5-325 MG PO TABS
1.0000 | ORAL_TABLET | ORAL | Status: DC | PRN
  Administered 2021-12-21: 1 via ORAL
  Filled 2021-12-21: qty 1

## 2021-12-21 MED ORDER — ACETAMINOPHEN 325 MG PO TABS: 650.0000 mg | ORAL_TABLET | Freq: Four times a day (QID) | ORAL | Status: DC | PRN

## 2021-12-21 MED ORDER — POTASSIUM CHLORIDE 10 MEQ/100ML IV SOLN: 10.0000 meq | INTRAVENOUS | Status: DC

## 2021-12-21 MED ORDER — ENOXAPARIN SODIUM 40 MG/0.4ML IJ SOSY
40.0000 mg | PREFILLED_SYRINGE | INTRAMUSCULAR | Status: DC
  Administered 2021-12-21: 40 mg via SUBCUTANEOUS
  Filled 2021-12-21: qty 0.4

## 2021-12-21 MED ORDER — POTASSIUM CHLORIDE CRYS ER 20 MEQ PO TBCR
40.0000 meq | EXTENDED_RELEASE_TABLET | ORAL | Status: AC
  Administered 2021-12-21: 40 meq via ORAL
  Filled 2021-12-21: qty 2

## 2021-12-21 MED ORDER — POTASSIUM CHLORIDE 10 MEQ/100ML IV SOLN
10.0000 meq | INTRAVENOUS | Status: AC
  Administered 2021-12-21: 10 meq via INTRAVENOUS
  Filled 2021-12-21: qty 100

## 2021-12-21 MED ORDER — MONTELUKAST SODIUM 10 MG PO TABS
10.0000 mg | ORAL_TABLET | Freq: Every day | ORAL | Status: DC
  Administered 2021-12-21: 10 mg via ORAL
  Filled 2021-12-21: qty 1

## 2021-12-21 MED ORDER — POTASSIUM CHLORIDE CRYS ER 20 MEQ PO TBCR: 10.0000 meq | EXTENDED_RELEASE_TABLET | Freq: Every day | ORAL | Status: DC

## 2021-12-21 MED ORDER — ONDANSETRON HCL 4 MG/2ML IJ SOLN: 4.0000 mg | Freq: Four times a day (QID) | INTRAMUSCULAR | Status: DC | PRN

## 2021-12-21 MED ORDER — POTASSIUM CHLORIDE CRYS ER 20 MEQ PO TBCR
40.0000 meq | EXTENDED_RELEASE_TABLET | Freq: Once | ORAL | Status: AC
  Administered 2021-12-21: 40 meq via ORAL
  Filled 2021-12-21: qty 2

## 2021-12-21 MED ORDER — TRAZODONE HCL 50 MG PO TABS: 25.0000 mg | ORAL_TABLET | Freq: Every evening | ORAL | Status: DC | PRN

## 2021-12-21 NOTE — Care Management Note (Signed)
Carry over: patient presented to ED after syncopal episode. Was found to be hypokalemic and orthostatic. She received IVF but remained orthostatic: HR 80 supine, 110 sitting. For observation admit.

## 2021-12-21 NOTE — Assessment & Plan Note (Signed)
-   Potassium will be replaced and magnesium level will be checked. ?

## 2021-12-21 NOTE — Assessment & Plan Note (Signed)
-   We will continue PPI therapy 

## 2021-12-21 NOTE — Assessment & Plan Note (Signed)
-   This is likely prerenal due to volume depletion and mild dehydration and could be contributing to #1. - She will be hydrated with IV normal saline and will follow BMP. - We will avoid nephrotoxins.

## 2021-12-21 NOTE — Assessment & Plan Note (Signed)
-   We will place on as needed IV labetalol. - ACE inhibitor and diuretic therapy will be held off.

## 2021-12-21 NOTE — Hospital Course (Addendum)
Melissa Alvarado is a 81 y.o. F American female with medical history significant for asthma, CAD, GERD, hypertension and dyslipidemia, who presented to the emergency room with acute onset of syncope. Patient recently has very poor appetite, she normally eats very little, she barely drink water.  She had diarrhea on Monday, without nausea vomiting.  Diarrhea has stopped since Tuesday, but the patient is still not eating. After arriving the hospital, patient was found to have significant hypokalemia, hyponatremia and acute kidney injury.  He was given fluids and potassium. Echocardiogram showed ejection fraction 60 to 65% with mitral prolapse.

## 2021-12-21 NOTE — Progress Notes (Signed)
  Progress Note   Patient: Melissa Alvarado HQP:[id number] DOB: 1940-09-21 DOA: 12/20/2021     0 DOS: the patient was seen and examined on 12/21/2021   Brief hospital course: Aspasia Rude is a 81 y.o. F American female with medical history significant for asthma, CAD, GERD, hypertension and dyslipidemia, who presented to the emergency room with acute onset of syncope. Patient recently has very poor appetite, she normally eats very little, she barely drink water.  She had diarrhea on Monday, without nausea vomiting.  Diarrhea has stopped since Tuesday, but the patient is still not eating. After arriving the hospital, patient was found to have significant hypokalemia, hyponatremia and acute kidney injury.  He was given fluids and potassium.  Assessment and Plan: Syncope and collapse. This appears to be secondary to dehydration and vasovagal reaction.  Patient had a diarrhea and poor p.o. intake.  She had received IV fluids, will continue for tonight. Telemetry so far has no arrhythmia, echocardiogram is pending.  Acute kidney injury secondary to dehydration. Hypokalemia secondary to poor p.o. intake. Patient was given IV potassium, also added potassium into the IV fluids. Renal function seems to be better already, continue IV fluids for tonight.  Essential hypertension. Hold ACE inhibitor in the hydrochlorothiazide.  Dyspepsia.  Anorexia. Patient has been having poor appetite for the past few months, decreased p.o. intake.  We will continue Protonix, will refer patient to GI as outpatient.  I will also add sucralfate.  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 Patient has not been formally diagnosed with dementia, however, she has been having memory issues, she became agitated at home for the last few days. Due to new environment, she will give lower dose Seroquel tonight.      Subjective:  Patient has some confusion, occasional agitation.  No shortness of breath.  Physical  Exam: Vitals:   12/21/21 0800 12/21/21 1200 12/21/21 1431 12/21/21 1514  BP:  137/73  (!) 154/68  Pulse:    84  Resp:    17  Temp:   98.5 F (36.9 C) 98.4 F (36.9 C)  TempSrc:   Oral   SpO2:    99%  Weight: 69.3 kg     Height:       General exam: Appears calm and comfortable  Respiratory system: Clear to auscultation. Respiratory effort normal. Cardiovascular system: S1 & S2 heard, RRR. No JVD, murmurs, rubs, gallops or clicks. No pedal edema. Gastrointestinal system: Abdomen is nondistended, soft and nontender. No organomegaly or masses felt. Normal bowel sounds heard. Central nervous system: Alert and oriented x2. No focal neurological deficits. Extremities: Symmetric 5 x 5 power. Skin: No rashes, lesions or ulcers Psychiatry: Mood & affect appropriate.   Data Reviewed:  Lab results reviewed.  Family Communication: Husband updated at bedside.  Disposition: Status is: Observation The patient remains OBS appropriate and will d/c before 2 midnights.  Planned Discharge Destination: Home    Time spent: no charge minutes  Author: Sharen Hones, MD 12/21/2021 3:28 PM  For on call review [email or web address].

## 2021-12-21 NOTE — Assessment & Plan Note (Signed)
-   Differential diagnosis would include neurally mediated/vasovagal syncope, cardiogenic, orthostatic hypotension, arrhythmia related and less likely hypoglycemia. - She will be admitted to an observation medical telemetry bed. - We will place her on hydration with IV normal saline especially given her AKI. - We will obtain a 2D echo and  bilateral carotid Doppler. - We will follow serial troponins.

## 2021-12-21 NOTE — H&P (Signed)
[place]   PATIENT NAME: Melissa Alvarado    MR#:  [id number]  DATE OF BIRTH:  06-Nov-1940  DATE OF ADMISSION:  12/20/2021  PRIMARY CARE PHYSICIAN: Baxter Hire, MD   Patient is coming from: Home  REQUESTING/REFERRING PHYSICIAN: Lavonia Drafts, MD  CHIEF COMPLAINT:   Chief Complaint  Patient presents with   Dizziness    HISTORY OF PRESENT ILLNESS:  Melissa Alvarado is a 81 y.o. F American female with medical history significant for asthma, CAD, GERD, hypertension and dyslipidemia, who presented to the emergency room with acute onset of syncope.  She was at the store looking for hollowing candy and was leaning against the counter when she started to slide down the counter.  Her husband caught her while she was going down but he thinks she may have hit her had.  She was also complaining of mild right shoulder pain without worsening in the range of motion.  No fever or chills.  No recent leg pain or edema or recent travels or surgeries.  No paresthesias or focal muscle weakness.  She denies any chest pain or palpitations.  No cough or wheezing or hemoptysis.  ED Course: When she the ER, BP was 148/60 with heart rate of 102 and otherwise normal vital signs.  Labs revealed hypokalemia of 3 and chloride of 94 with creatinine 1.57 above previous levels.  CBC showed mild anemia.  Influenza antigens and COVID-19 PCR came back negative. EKG as reviewed by me : EKG showed normal sinus rhythm with a rate of 71 with minimal voltage criteria for LVH and Q waves anteroseptally. Imaging: Noncontrasted head CT scan showed no acute intracranial normalities and 2 view chest x-ray showed no acute cardiopulmonary disease.  The patient was given 40 mEq  p.o. potassium chloride and 1 L bolus of IV normal saline.  She will be admitted to an observation medical telemetry bed for further evaluation and management. PAST MEDICAL HISTORY:   Past Medical History:  Diagnosis Date   Asthma    Coronary  artery disease    Esophageal dysphagia    GERD (gastroesophageal reflux disease)    occasional   Hyperlipidemia    Hypertension     PAST SURGICAL HISTORY:   Past Surgical History:  Procedure Laterality Date   CARDIAC CATHETERIZATION     CATARACT EXTRACTION     CHOLECYSTECTOMY     COLONOSCOPY WITH PROPOFOL N/A 07/04/2015   Procedure: COLONOSCOPY WITH PROPOFOL;  Surgeon: Josefine Class, MD;  Location: [facility] ENDOSCOPY;  Service: Endoscopy;  Laterality: N/A;   CORONARY ANGIOPLASTY     DILATION AND CURETTAGE, DIAGNOSTIC / THERAPEUTIC     ESOPHAGOGASTRODUODENOSCOPY (EGD) WITH PROPOFOL N/A 03/09/2019   Procedure: ESOPHAGOGASTRODUODENOSCOPY (EGD) WITH PROPOFOL;  Surgeon: Toledo, Benay Pike, MD;  Location: [facility] ENDOSCOPY;  Service: Gastroenterology;  Laterality: N/A;   HYSTEROSCOPY WITH D & C N/A 08/26/2015   Procedure: DILATATION AND CURETTAGE /HYSTEROSCOPY;  Surgeon: Lorette Ang, MD;  Location: [facility] ORS;  Service: Gynecology;  Laterality: N/A;   TUBAL LIGATION      SOCIAL HISTORY:   Social History   Tobacco Use   Smoking status: Never   Smokeless tobacco: Never  Substance Use Topics   Alcohol use: No    FAMILY HISTORY:   Family History  Problem Relation Age of Onset   Breast cancer Sister     DRUG ALLERGIES:   Allergies  Allergen Reactions   Atorvastatin     unknown   Penicillins  Has patient had a PCN reaction causing immediate rash, facial/tongue/throat swelling, SOB or lightheadedness with hypotension: unknown Has patient had a PCN reaction causing severe rash involving mucus membranes or skin necrosis: unknown Has patient had a PCN reaction that required hospitalization unknown Has patient had a PCN reaction occurring within the last 10 years: unknown If all of the above answers are "NO", then may proceed with Cephalosporin use.    Sulfa Antibiotics Itching   Zetia [Ezetimibe]     unknown    REVIEW OF SYSTEMS:   ROS As per history of present  illness. All pertinent systems were reviewed above. Constitutional, HEENT, cardiovascular, respiratory, GI, GU, musculoskeletal, neuro, psychiatric, endocrine, integumentary and hematologic systems were reviewed and are otherwise negative/unremarkable except for positive findings mentioned above in the HPI.   MEDICATIONS AT HOME:   Prior to Admission medications   Medication Sig Start Date End Date Taking? Authorizing Provider  albuterol (ACCUNEB) 1.25 MG/3ML nebulizer solution Take 1 ampule by nebulization every 6 (six) hours as needed for wheezing.    [provider]  albuterol (PROAIR HFA) 108 (90 Base) MCG/ACT inhaler Inhale 2 puffs into the lungs every 4 (four) hours as needed.    [provider]  azelastine (ASTELIN) 0.1 % nasal spray Place 1 spray into both nostrils 2 (two) times daily. Use in each nostril as directed    [provider]  clotrimazole-betamethasone (LOTRISONE) lotion Apply topically 2 (two) times daily.    [provider]  Fluticasone-Salmeterol (ADVAIR) 250-50 MCG/DOSE AEPB Inhale 1 puff into the lungs 2 (two) times daily.    [provider]  gabapentin (NEURONTIN) 100 MG capsule Take 100 mg by mouth 3 (three) times daily.    [provider]  HYDROcodone-acetaminophen (NORCO/VICODIN) 5-325 MG tablet Take 1 tablet by mouth every 4 (four) hours as needed for moderate pain. 11/07/19   Cuthriell, [name], PA-C  montelukast (SINGULAIR) 10 MG tablet Take 10 mg by mouth at bedtime.    [provider]  pantoprazole (PROTONIX) 40 MG tablet Take 40 mg by mouth daily.    [provider]  potassium chloride (K-DUR,KLOR-CON) 10 MEQ tablet Take 10 mEq by mouth daily.    [provider]  predniSONE (DELTASONE) 10 MG tablet Take 1 tablet (10 mg total) by mouth daily. Patient not taking: Reported on 12/20/2021 11/07/19   Cuthriell, [name], PA-C  quinapril-hydrochlorothiazide (ACCURETIC) 20-25 MG tablet Take  1 tablet by mouth daily.    [provider]  tiZANidine (ZANAFLEX) 4 MG tablet Take 4 mg by mouth every 6 (six) hours as needed for muscle spasms.    [provider]      VITAL SIGNS:  Blood pressure (!) 176/66, pulse 86, temperature 97.9 F (36.6 C), temperature source Oral, resp. rate 16, height 5' (1.524 m), SpO2 100 %.  PHYSICAL EXAMINATION:  Physical Exam  GENERAL:  81 y.o.-year-old African-American female patient lying in the bed with no acute distress.  EYES: Pupils equal, round, reactive to light and accommodation. No scleral icterus. Extraocular muscles intact.  HEENT: Head atraumatic, normocephalic. Oropharynx and nasopharynx clear.  NECK:  Supple, no jugular venous distention. No thyroid enlargement, no tenderness.  LUNGS: Normal breath sounds bilaterally, no wheezing, rales,rhonchi or crepitation. No use of accessory muscles of respiration.  CARDIOVASCULAR: Regular rate and rhythm, S1, S2 normal. No murmurs, rubs, or gallops.  ABDOMEN: Soft, nondistended, nontender. Bowel sounds present. No organomegaly or mass.  EXTREMITIES: No pedal edema, cyanosis, or clubbing.  NEUROLOGIC: Cranial nerves II through XII are intact. Muscle strength 5/5 in all extremities. Sensation intact. Gait not checked.  PSYCHIATRIC: The patient is alert and oriented x 3.  Normal affect and good eye contact. SKIN: No obvious rash, lesion, or ulcer.   LABORATORY PANEL:   CBC Recent Labs  Lab 12/21/21 0509  WBC 8.8  HGB 11.2*  HCT 34.4*  PLT 258   ------------------------------------------------------------------------------------------------------------------  Chemistries  Recent Labs  Lab 12/21/21 0509  NA 134*  K 2.8*  CL 96*  CO2 26  GLUCOSE 92  BUN 13  CREATININE 1.17*  CALCIUM 8.9  MG 1.7   ------------------------------------------------------------------------------------------------------------------  Cardiac Enzymes No results for input(s): "TROPONINI"  in the last 168 hours. ------------------------------------------------------------------------------------------------------------------  RADIOLOGY:  DG Chest 2 View  Result Date: 12/20/2021 CLINICAL DATA:  Shortness of breath EXAM: CHEST - 2 VIEW COMPARISON:  12/13/2009, 09/01/2018 FINDINGS: The heart size and mediastinal contours are within normal limits. Aortic atherosclerosis. No focal airspace consolidation, pleural effusion, or pneumothorax. The visualized skeletal structures are unremarkable. IMPRESSION: No active cardiopulmonary disease. Electronically Signed   By: Davina Poke D.O.   On: 12/20/2021 18:38   CT Head Wo Contrast  Result Date: 12/20/2021 CLINICAL DATA:  Dizziness EXAM: CT HEAD WITHOUT CONTRAST TECHNIQUE: Contiguous axial images were obtained from the base of the skull through the vertex without intravenous contrast. RADIATION DOSE REDUCTION: This exam was performed according to the departmental dose-optimization program which includes automated exposure control, adjustment of the mA and/or kV according to patient size and/or use of iterative reconstruction technique. COMPARISON:  MRI brain 11/18/2014 FINDINGS: Brain: No evidence of acute infarction, hemorrhage, hydrocephalus, extra-axial collection or mass lesion/mass effect. Sequela of moderate chronic microvascular ischemic change. Vascular: No hyperdense vessel or unexpected calcification. Skull: Normal. Negative for fracture or focal lesion. Sinuses/Orbits: Bilateral lens replacements.  Impacted cerumen Other: None. IMPRESSION: No acute intracranial abnormality. Electronically Signed   By: Marin Roberts M.D.   On: 12/20/2021 14:41      IMPRESSION AND PLAN:  Assessment and Plan: Syncope, vasovagal - Differential diagnosis would include neurally mediated/vasovagal syncope, cardiogenic, orthostatic hypotension, arrhythmia related and less likely hypoglycemia. - She will be admitted to an observation medical telemetry  bed. - We will place her on hydration with IV normal saline especially given her AKI. - We will obtain a 2D echo and  bilateral carotid Doppler. - We will follow serial troponins.  AKI (acute kidney injury) ([facility]) - This is likely prerenal due to volume depletion and mild dehydration and could be contributing to #1. - She will be hydrated with IV normal saline and will follow BMP. - We will avoid nephrotoxins.  Hypokalemia - Potassium will be replaced and magnesium level will be checked.  Dyslipidemia - We will continue her statin therapy.  Essential hypertension - We will place on as needed IV labetalol. - ACE inhibitor and diuretic therapy will be held off.  GERD without esophagitis - We will continue PPI therapy       DVT prophylaxis: Lovenox..  Advanced Care Planning:  Code Status: full code.  Family Communication:  The plan of care was discussed in details with the patient (and family). I answered all questions. The patient agreed to proceed with the above mentioned plan. Further management will depend upon hospital course. Disposition Plan: Back to previous home environment Consults called: none.  All the records are reviewed and case discussed with ED provider.  Status is: Observation   I certify that at the time  of admission, it is my clinical judgment that the patient will require  hospital care extending LESS than 2 midnights.                            Dispo: The patient is from: Home              Anticipated d/c is to: Home              Patient currently is not medically stable to d/c.              Difficult to place patient: No  Christel Mormon M.D on 12/21/2021 at 7:09 AM  [facility]   From 7 PM-7 AM, contact night-coverage [email or web address]  CC: Primary care physician; Baxter Hire, MD

## 2021-12-21 NOTE — Progress Notes (Signed)
*  PRELIMINARY RESULTS* Echocardiogram 2D Echocardiogram has been performed.  Melissa Alvarado 12/21/2021, 11:46 AM

## 2021-12-21 NOTE — Assessment & Plan Note (Signed)
-   We will continue her statin therapy. 

## 2021-12-22 DIAGNOSIS — N179 Acute kidney failure, unspecified: Secondary | ICD-10-CM | POA: Diagnosis not present

## 2021-12-22 DIAGNOSIS — R55 Syncope and collapse: Secondary | ICD-10-CM | POA: Diagnosis not present

## 2021-12-22 DIAGNOSIS — I10 Essential (primary) hypertension: Secondary | ICD-10-CM | POA: Diagnosis not present

## 2021-12-22 LAB — MAGNESIUM: Magnesium: 1.7 mg/dL (ref 1.7–2.4)

## 2021-12-22 LAB — BASIC METABOLIC PANEL
Anion gap: 7 (ref 5–15)
BUN: 12 mg/dL (ref 8–23)
CO2: 27 mmol/L (ref 22–32)
Calcium: 8.5 mg/dL — ABNORMAL LOW (ref 8.9–10.3)
Chloride: 99 mmol/L (ref 98–111)
Creatinine, Ser: 0.95 mg/dL (ref 0.44–1.00)
GFR, Estimated: 60 mL/min — ABNORMAL LOW (ref >60)
Glucose, Bld: 71 mg/dL (ref 70–99)
Potassium: 3.6 mmol/L (ref 3.5–5.1)
Sodium: 133 mmol/L — ABNORMAL LOW (ref 135–145)

## 2021-12-22 LAB — GLUCOSE, CAPILLARY: Glucose-Capillary: 73 mg/dL (ref 70–99)

## 2021-12-22 LAB — PHOSPHORUS: Phosphorus: 1.6 mg/dL — ABNORMAL LOW (ref 2.5–4.6)

## 2021-12-22 MED ORDER — MAGNESIUM SULFATE 2 GM/50ML IV SOLN: 2.0000 g | Freq: Once | INTRAVENOUS | Status: DC

## 2021-12-22 MED ORDER — AMLODIPINE BESYLATE 5 MG PO TABS: 5.0000 mg | ORAL_TABLET | Freq: Every day | ORAL | 0 refills | Status: DC

## 2021-12-22 MED ORDER — POTASSIUM PHOSPHATES 15 MMOLE/5ML IV SOLN
30.0000 mmol | Freq: Once | INTRAVENOUS | Status: AC
  Administered 2021-12-22: 30 mmol via INTRAVENOUS
  Filled 2021-12-22: qty 10

## 2021-12-22 MED ORDER — MAGNESIUM OXIDE -MG SUPPLEMENT 400 (240 MG) MG PO TABS
400.0000 mg | ORAL_TABLET | Freq: Two times a day (BID) | ORAL | Status: DC
  Administered 2021-12-22: 400 mg via ORAL
  Filled 2021-12-22: qty 1

## 2021-12-22 MED ORDER — MAGNESIUM OXIDE -MG SUPPLEMENT 400 (240 MG) MG PO TABS: 400.0000 mg | ORAL_TABLET | Freq: Two times a day (BID) | ORAL | 0 refills | Status: AC

## 2021-12-22 MED ORDER — MAGNESIUM CHLORIDE 64 MG PO TBEC
1.0000 | DELAYED_RELEASE_TABLET | Freq: Two times a day (BID) | ORAL | Status: DC
  Filled 2021-12-22: qty 1

## 2021-12-22 MED ORDER — QUETIAPINE FUMARATE 25 MG PO TABS: 25.0000 mg | ORAL_TABLET | Freq: Every day | ORAL | 0 refills | Status: AC

## 2021-12-22 NOTE — Progress Notes (Signed)
Discharge instructions reviewed with patient including followup visits and new medications.  Understanding was verbalized and all questions were answered.  IV removed without complication; patient tolerated well.  Patient discharged home via wheelchair in stable condition escorted by volunteer staff.  

## 2021-12-22 NOTE — TOC Initial Note (Signed)
Transition of Care [facility]) - Initial/Assessment Note    Patient Details  Name: Melissa Alvarado MRN: [id number] Date of Birth: [birth year]  [facility]) CM/SW Contact:    Laurena Slimmer, RN Phone Number: 12/22/2021, 10:53 AM  Clinical Narrative:                   Transition of Care [facility]) Screening Note   Patient Details  Name: Melissa Alvarado Date of Birth: [birth year]   Transition of Care [facility]) CM/SW Contact:    Laurena Slimmer, RN Phone Number: 12/22/2021, 10:54 AM    Transition of Care Department [facility]) has reviewed patient and no TOC needs have been identified at this time. We will continue to monitor patient advancement through interdisciplinary progression rounds. If new patient transition needs arise, please place a TOC consult.         Patient Goals and CMS Choice        Expected Discharge Plan and Services                                                Prior Living Arrangements/Services                       Activities of Daily Living Home Assistive Devices/Equipment: Eyeglasses ADL Screening (condition at time of admission) Patient's cognitive ability adequate to safely complete daily activities?: Yes Is the patient deaf or have difficulty hearing?: No Does the patient have difficulty seeing, even when wearing glasses/contacts?: No Does the patient have difficulty concentrating, remembering, or making decisions?: No Patient able to express need for assistance with ADLs?: Yes Does the patient have difficulty dressing or bathing?: No Independently performs ADLs?: Yes (appropriate for developmental age) Does the patient have difficulty walking or climbing stairs?: No Weakness of Legs: None Weakness of Arms/Hands: None  Permission Sought/Granted                  Emotional Assessment              Admission diagnosis:  Orthostatic hypotension [I95.1] Dehydration [E86.0] Orthostatic dizziness [R42] Patient Active  Problem List   Diagnosis Date Noted   Hypophosphatemia 12/22/2021   Orthostatic hypotension 12/21/2021   Syncope, vasovagal 12/21/2021   AKI (acute kidney injury) ([place]) 12/21/2021   Hypokalemia 12/21/2021   GERD without esophagitis 12/21/2021   Essential hypertension 12/21/2021   Dyslipidemia 12/21/2021   Dyspepsia 12/21/2021   Anorexia 12/21/2021   PCP:  Baxter Hire, MD Pharmacy:   [id number] [id number]: [phone]: [phone]    Social Determinants of Health (SDOH) Interventions    Readmission Risk Interventions     No data to display

## 2021-12-22 NOTE — Discharge Summary (Signed)
Physician Discharge Summary   Patient: Melissa Alvarado MRN: [id number] DOB: 05-11-1940  Admit date:     12/20/2021  Discharge date: 12/22/21  Discharge Physician: Sharen Hones   PCP: Baxter Hire, MD   Recommendations at discharge:   Follow-up with PCP in 1 week. Refer to neurology for dementia. Follow-up with GI in 1 month for dyspepsia. Follow-up with Dr. Rockey Situ from cardiology for mitral prolapse.  Discharge Diagnoses: Active Problems:   Syncope, vasovagal   AKI (acute kidney injury) ([place])   Hypokalemia   GERD without esophagitis   Essential hypertension   Dyslipidemia   Dyspepsia   Anorexia   Hypophosphatemia  Resolved Problems:   * No resolved hospital problems. *  Hospital Course: [name] is a 81 y.o. F American female with medical history significant for asthma, CAD, GERD, hypertension and dyslipidemia, who presented to the emergency room with acute onset of syncope. Patient recently has very poor appetite, she normally eats very little, she barely drink water.  She had diarrhea on Monday, without nausea vomiting.  Diarrhea has stopped since Tuesday, but the patient is still not eating. After arriving the hospital, patient was found to have significant hypokalemia, hyponatremia and acute kidney injury.  He was given fluids and potassium. Echocardiogram showed ejection fraction 60 to 65% with mitral prolapse.  Assessment and Plan:  Syncope and collapse. This appears to be secondary to dehydration and vasovagal reaction.  Patient had a diarrhea and poor p.o. intake.  She had received IV fluids.  Telemetry did not show any arrhythmia. Patient has a mitral prolapse on telemetry, never had a syncope in the past.  This is probably not the source of syncope as patient had significant dehydration from diarrhea.  Mitral valve prolapse. Ejection fraction is normal, QTc 457. Follow-up with cardiology in the future. However, there is very little treatment option as  patient has significant dementia and now with advanced age.   Acute kidney injury secondary to dehydration. Hypokalemia secondary to poor p.o. intake. Hypophosphatemia. Renal function normalized, potassium normalized.  We will give her phosphorus today before discharge.  Magnesium 1.7, given mitral prolapse, will give oral magnesium.   Essential hypertension. Change blood pressure medicine to amlodipine.  Discontinue lisinopril and thiazide.   Dyspepsia.  Anorexia. Patient is on Protonix, will refer patient to GI after discharge.   Dementia with behavior abnormality. Ruled in Patient had a significant agitation and sundowning in the hospital.  She also has confusion, she definitely has dementia.  We will refer her to neurology after discharge.        Consultants: None Procedures performed: None  Disposition: Home Diet recommendation:  Discharge Diet Orders (From admission, onward)     Start     Ordered   12/22/21 0000  Diet - low sodium heart healthy        12/22/21 1110           Cardiac diet DISCHARGE MEDICATION: Allergies as of 12/22/2021       Reactions   Atorvastatin    unknown   Penicillins    Has patient had a PCN reaction causing immediate rash, facial/tongue/throat swelling, SOB or lightheadedness with hypotension: unknown Has patient had a PCN reaction causing severe rash involving mucus membranes or skin necrosis: unknown Has patient had a PCN reaction that required hospitalization unknown Has patient had a PCN reaction occurring within the last 10 years: unknown If all of the above answers are "NO", then may proceed with Cephalosporin use.  Sulfa Antibiotics Itching   Zetia [ezetimibe]    unknown        Medication List     STOP taking these medications    HYDROcodone-acetaminophen 5-325 MG tablet Commonly known as: NORCO/VICODIN   meloxicam 7.5 MG tablet Commonly known as: MOBIC   predniSONE 10 MG tablet Commonly known as: DELTASONE    quinapril-hydrochlorothiazide 20-25 MG tablet Commonly known as: ACCURETIC       TAKE these medications    amLODipine 5 MG tablet Commonly known as: NORVASC Take 1 tablet (5 mg total) by mouth daily.   azelastine 0.1 % nasal spray Commonly known as: ASTELIN Place 1 spray into both nostrils 2 (two) times daily. Use in each nostril as directed   clotrimazole-betamethasone lotion Commonly known as: LOTRISONE Apply topically 2 (two) times daily.   Fluticasone-Salmeterol 250-50 MCG/DOSE Aepb Commonly known as: ADVAIR Inhale 1 puff into the lungs 2 (two) times daily.   gabapentin 100 MG capsule Commonly known as: NEURONTIN Take 100 mg by mouth 3 (three) times daily.   magnesium oxide 400 (240 Mg) MG tablet Commonly known as: MAG-OX Take 1 tablet (400 mg total) by mouth 2 (two) times daily for 7 days.   montelukast 10 MG tablet Commonly known as: SINGULAIR Take 10 mg by mouth at bedtime.   pantoprazole 40 MG tablet Commonly known as: PROTONIX Take 40 mg by mouth daily.   potassium chloride 10 MEQ tablet Commonly known as: KLOR-CON M Take 10 mEq by mouth daily.   ProAir HFA 108 (90 Base) MCG/ACT inhaler Generic drug: albuterol Inhale 2 puffs into the lungs every 4 (four) hours as needed.   albuterol 1.25 MG/3ML nebulizer solution Commonly known as: ACCUNEB Take 1 ampule by nebulization every 6 (six) hours as needed for wheezing.   QUEtiapine 25 MG tablet Commonly known as: SEROQUEL Take 1 tablet (25 mg total) by mouth at bedtime for 14 days.   rosuvastatin 10 MG tablet Commonly known as: CRESTOR Take 10 mg by mouth daily.   tiZANidine 4 MG tablet Commonly known as: ZANAFLEX Take 4 mg by mouth every 6 (six) hours as needed for muscle spasms.        Follow-up Information     Baxter Hire, MD Follow up in 1 week(s).   Specialty: Internal Medicine Contact information: [place] [address] [phone]         Minna Merritts, MD Follow up in 1 month(s).   Specialty: Cardiology Contact information: [place] [address] [phone]         [facility] Follow up in 1 month(s).   Contact information: [place] [phone]               Discharge Exam: Danley Danker Weights   12/21/21 0800 12/22/21 0452  Weight: 69.3 kg 60.3 kg   General exam: Appears calm and comfortable  Respiratory system: Clear to auscultation. Respiratory effort normal. Cardiovascular system: S1 & S2 heard, RRR. No JVD, murmurs, rubs, gallops or clicks. No pedal edema. Gastrointestinal system: Abdomen is nondistended, soft and nontender. No organomegaly or masses felt. Normal bowel sounds heard. Central nervous system: Alert and oriented x2. No focal neurological deficits. Extremities: Symmetric 5 x 5 power. Skin: No rashes, lesions or ulcers Psychiatry: Mood & affect appropriate.    Condition at discharge: fair  The results of significant diagnostics from this hospitalization (including imaging, microbiology, ancillary  and laboratory) are listed below for reference.   Imaging Studies: ECHOCARDIOGRAM COMPLETE  Result Date: 12/21/2021    ECHOCARDIOGRAM REPORT   Patient Name:   GERMAINE RIPP Date of Exam: 12/21/2021 Medical Rec #:  [id number]     Height:       60.0 in Accession #:    [id number]    Weight:       152.8 lb Date of Birth:  1941-01-16     BSA:          1.665 m Patient Age:    20 years      BP:           176/66 mmHg Patient Gender: F             HR:           41 bpm. Exam Location:  [facility] Procedure: 2D Echo, Color Doppler and Cardiac Doppler Indications:     R55 Syncope  History:         Patient has no prior history of Echocardiogram examinations.                  CAD; Risk Factors:Hypertension and Dyslipidemia.  Sonographer:     Charmayne Sheer Referring Phys:  [id number] [place] Diagnosing Phys: Ida Rogue MD  IMPRESSIONS  1. Left ventricular ejection fraction, by estimation, is 60 to 65%. The left ventricle has normal function. The left ventricle has no regional wall motion abnormalities. Left ventricular diastolic parameters are consistent with Grade I diastolic dysfunction (impaired relaxation).  2. Right ventricular systolic function is normal. The right ventricular size is normal. There is mildly elevated pulmonary artery systolic pressure. The estimated right ventricular systolic pressure is 27.5 mmHg.  3. The mitral valve is normal in structure. Moderate mitral valve regurgitation. No evidence of mitral stenosis. There is moderate late systolic prolapse of multiple scallops of the posterior leaflet of the mitral valve.  4. The aortic valve is normal in structure. Aortic valve regurgitation is not visualized. Aortic valve sclerosis/calcification is present, without any evidence of aortic stenosis.  5. The inferior vena cava is normal in size with greater than 50% respiratory variability, suggesting right atrial pressure of 3 mmHg. FINDINGS  Left Ventricle: Left ventricular ejection fraction, by estimation, is 60 to 65%. The left ventricle has normal function. The left ventricle has no regional wall motion abnormalities. The left ventricular internal cavity size was normal in size. There is  no left ventricular hypertrophy. Left ventricular diastolic parameters are consistent with Grade I diastolic dysfunction (impaired relaxation). Right Ventricle: The right ventricular size is normal. No increase in right ventricular wall thickness. Right ventricular systolic function is normal. There is mildly elevated pulmonary artery systolic pressure. The tricuspid regurgitant velocity is 3.16  m/s, and with an assumed right atrial pressure of 5 mmHg, the estimated right ventricular systolic pressure is 17.0 mmHg. Left Atrium: Left atrial size was normal in size. Right Atrium: Right atrial size was normal in size. Pericardium:  There is no evidence of pericardial effusion. Mitral Valve: The mitral valve is normal in structure. There is moderate late systolic prolapse of multiple scallops of the posterior leaflet of the mitral valve. Moderate mitral valve regurgitation. No evidence of mitral valve stenosis. Tricuspid Valve: The tricuspid valve is normal in structure. Tricuspid valve regurgitation is mild . No evidence of tricuspid stenosis. Aortic Valve: The aortic valve is normal in structure. Aortic valve regurgitation is not visualized. Aortic valve sclerosis/calcification is present, without any  evidence of aortic stenosis. Aortic valve mean gradient measures 6.0 mmHg. Aortic valve peak  gradient measures 11.3 mmHg. Aortic valve area, by VTI measures 2.76 cm. Pulmonic Valve: The pulmonic valve was normal in structure. Pulmonic valve regurgitation is not visualized. No evidence of pulmonic stenosis. Aorta: The aortic root is normal in size and structure. Venous: The inferior vena cava is normal in size with greater than 50% respiratory variability, suggesting right atrial pressure of 3 mmHg. IAS/Shunts: No atrial level shunt detected by color flow Doppler.  LEFT VENTRICLE PLAX 2D LVIDd:         4.10 cm   Diastology LVIDs:         2.60 cm   LV e' medial:    7.40 cm/s LV PW:         1.00 cm   LV E/e' medial:  11.3 LV IVS:        1.00 cm   LV e' lateral:   9.25 cm/s LVOT diam:     2.10 cm   LV E/e' lateral: 9.0 LV SV:         105 LV SV Index:   63 LVOT Area:     3.46 cm  RIGHT VENTRICLE RV Basal diam:  2.90 cm RV S prime:     14.80 cm/s LEFT ATRIUM             Index        RIGHT ATRIUM           Index LA diam:        3.90 cm 2.34 cm/m   RA Area:     11.80 cm LA Vol (A2C):   46.2 ml 27.75 ml/m  RA Volume:   25.50 ml  15.32 ml/m LA Vol (A4C):   39.8 ml 23.91 ml/m LA Biplane Vol: 43.0 ml 25.83 ml/m  AORTIC VALVE                     PULMONIC VALVE AV Area (Vmax):    2.78 cm      PV Vmax:       0.72 m/s AV Area (Vmean):   2.54 cm       PV Vmean:      50.500 cm/s AV Area (VTI):     2.76 cm      PV VTI:        0.133 m AV Vmax:           168.00 cm/s   PV Peak grad:  2.1 mmHg AV Vmean:          105.000 cm/s  PV Mean grad:  1.0 mmHg AV VTI:            0.380 m AV Peak Grad:      11.3 mmHg AV Mean Grad:      6.0 mmHg LVOT Vmax:         135.00 cm/s LVOT Vmean:        77.100 cm/s LVOT VTI:          0.303 m LVOT/AV VTI ratio: 0.80  AORTA Ao Root diam: 2.60 cm MITRAL VALVE                TRICUSPID VALVE MV Area (PHT): 4.29 cm     TR Peak grad:   39.9 mmHg MV Decel Time: 177 msec     TR Vmax:        316.00 cm/s MV E velocity: 83.30 cm/s MV A velocity:  100.00 cm/s  SHUNTS MV E/A ratio:  0.83         Systemic VTI:  0.30 m                             Systemic Diam: 2.10 cm Ida Rogue MD Electronically signed by Ida Rogue MD Signature Date/Time: 12/21/2021/5:01:27 PM    Final    US Carotid Bilateral  Result Date: 12/21/2021 CLINICAL DATA:  81 year old female with syncope EXAM: BILATERAL CAROTID DUPLEX ULTRASOUND TECHNIQUE: Pearline Cables scale imaging, color Doppler and duplex ultrasound were performed of bilateral carotid and vertebral arteries in the neck. COMPARISON:  None Available. FINDINGS: Criteria: Quantification of carotid stenosis is based on velocity parameters that correlate the residual internal carotid diameter with NASCET-based stenosis levels, using the diameter of the distal internal carotid lumen as the denominator for stenosis measurement. The following velocity measurements were obtained: RIGHT ICA:  Systolic 87 cm/sec, Diastolic 23 cm/sec CCA:  63 cm/sec SYSTOLIC ICA/CCA RATIO:  1.4 ECA:  71 cm/sec LEFT ICA:  Systolic 82 cm/sec, Diastolic 24 cm/sec CCA:  71 cm/sec SYSTOLIC ICA/CCA RATIO:  1.2 ECA:  65 cm/sec Right Brachial SBP: Not acquired Left Brachial SBP: Not acquired RIGHT CAROTID ARTERY: No significant calcifications of the right common carotid artery. Intermediate waveform maintained. Heterogeneous and partially calcified plaque  at the right carotid bifurcation. No significant lumen shadowing. Low resistance waveform of the right ICA. No significant tortuosity. RIGHT VERTEBRAL ARTERY: Antegrade flow with low resistance waveform. LEFT CAROTID ARTERY: No significant calcifications of the left common carotid artery. Intermediate waveform maintained. Heterogeneous and partially calcified plaque at the left carotid bifurcation without significant lumen shadowing. Low resistance waveform of the left ICA. No significant tortuosity. LEFT VERTEBRAL ARTERY:  Antegrade flow with low resistance waveform. IMPRESSION: Color duplex indicates minimal heterogeneous and calcified plaque, with no hemodynamically significant stenosis by duplex criteria in the extracranial cerebrovascular circulation. Signed, Dulcy Fanny. Nadene Rubins, RPVI [facility] Electronically Signed   By: Corrie Mckusick D.O.   On: 12/21/2021 10:09   DG Chest 2 View  Result Date: 12/20/2021 CLINICAL DATA:  Shortness of breath EXAM: CHEST - 2 VIEW COMPARISON:  12/13/2009, 09/01/2018 FINDINGS: The heart size and mediastinal contours are within normal limits. Aortic atherosclerosis. No focal airspace consolidation, pleural effusion, or pneumothorax. The visualized skeletal structures are unremarkable. IMPRESSION: No active cardiopulmonary disease. Electronically Signed   By: Davina Poke D.O.   On: 12/20/2021 18:38   CT Head Wo Contrast  Result Date: 12/20/2021 CLINICAL DATA:  Dizziness EXAM: CT HEAD WITHOUT CONTRAST TECHNIQUE: Contiguous axial images were obtained from the base of the skull through the vertex without intravenous contrast. RADIATION DOSE REDUCTION: This exam was performed according to the departmental dose-optimization program which includes automated exposure control, adjustment of the mA and/or kV according to patient size and/or use of iterative reconstruction technique. COMPARISON:  MRI brain  11/18/2014 FINDINGS: Brain: No evidence of acute infarction, hemorrhage, hydrocephalus, extra-axial collection or mass lesion/mass effect. Sequela of moderate chronic microvascular ischemic change. Vascular: No hyperdense vessel or unexpected calcification. Skull: Normal. Negative for fracture or focal lesion. Sinuses/Orbits: Bilateral lens replacements.  Impacted cerumen Other: None. IMPRESSION: No acute intracranial abnormality. Electronically Signed   By: Marin Roberts M.D.   On: 12/20/2021 14:41    Microbiology: Results for orders placed or performed during the hospital encounter of 12/20/21  Resp Panel by RT-PCR (Flu A&B, Covid) Anterior  Nasal Swab     Status: None   Collection Time: 12/20/21  6:01 PM   Specimen: Anterior Nasal Swab  Result Value Ref Range Status   SARS Coronavirus 2 by RT PCR NEGATIVE NEGATIVE Final    Comment: (NOTE) SARS-CoV-2 target nucleic acids are NOT DETECTED.  The SARS-CoV-2 RNA is generally detectable in upper respiratory specimens during the acute phase of infection. The lowest concentration of SARS-CoV-2 viral copies this assay can detect is 138 copies/mL. A negative result does not preclude SARS-Cov-2 infection and should not be used as the sole basis for treatment or other patient management decisions. A negative result may occur with  improper specimen collection/handling, submission of specimen other than nasopharyngeal swab, presence of viral mutation(s) within the areas targeted by this assay, and inadequate number of viral copies(<138 copies/mL). A negative result must be combined with clinical observations, patient history, and epidemiological information. The expected result is Negative.  Fact Sheet for Patients:  EntrepreneurPulse.com.au  Fact Sheet for Healthcare Providers:  IncredibleEmployment.be  This test is no t yet approved or cleared by the [place] FDA and  has been authorized for detection  and/or diagnosis of SARS-CoV-2 by FDA under an Emergency Use Authorization (EUA). This EUA will remain  in effect (meaning this test can be used) for the duration of the COVID-19 declaration under Section 564(b)(1) of the Act, 21 U.S.C.section 360bbb-3(b)(1), unless the authorization is terminated  or revoked sooner.       Influenza A by PCR NEGATIVE NEGATIVE Final   Influenza B by PCR NEGATIVE NEGATIVE Final    Comment: (NOTE) The Xpert Xpress SARS-CoV-2/FLU/RSV plus assay is intended as an aid in the diagnosis of influenza from Nasopharyngeal swab specimens and should not be used as a sole basis for treatment. Nasal washings and aspirates are unacceptable for Xpert Xpress SARS-CoV-2/FLU/RSV testing.  Fact Sheet for Patients: EntrepreneurPulse.com.au  Fact Sheet for Healthcare Providers: IncredibleEmployment.be  This test is not yet approved or cleared by the [place] FDA and has been authorized for detection and/or diagnosis of SARS-CoV-2 by FDA under an Emergency Use Authorization (EUA). This EUA will remain in effect (meaning this test can be used) for the duration of the COVID-19 declaration under Section 564(b)(1) of the Act, 21 U.S.C. section 360bbb-3(b)(1), unless the authorization is terminated or revoked.  Performed at [facility], [address] [place], [place] [address]     Labs: CBC: Recent Labs  Lab 12/20/21 1211 12/21/21 0509  WBC 7.7 8.8  HGB 11.0* 11.2*  HCT 33.8* 34.4*  MCV 79.9* 80.8  PLT 247 509   Basic Metabolic Panel: Recent Labs  Lab 12/20/21 1211 12/21/21 0509 12/21/21 1608 12/22/21 0401  NA 135 134*  --  133*  K 3.0* 2.8* 3.9 3.6  CL 94* 96*  --  99  CO2 28 26  --  27  GLUCOSE 141* 92  --  71  BUN 14 13  --  12  CREATININE 1.57* 1.17*  --  0.95  CALCIUM 9.6 8.9  --  8.5*  MG  --  1.7  --  1.7  PHOS  --   --   --  1.6*   Liver Function Tests: No results for input(s): "AST",  "ALT", "ALKPHOS", "BILITOT", "PROT", "ALBUMIN" in the last 168 hours. CBG: Recent Labs  Lab 12/22/21 0445  GLUCAP 73    Discharge time spent: greater than 30 minutes.  Signed: Sharen Hones, MD [facility] 12/22/2021

## 2021-12-22 NOTE — Plan of Care (Signed)
  Problem: Nutrition: Goal: Adequate nutrition will be maintained Outcome: Progressing   Problem: Coping: Goal: Level of anxiety will decrease Outcome: Progressing   Problem: Pain Managment: Goal: General experience of comfort will improve Outcome: Progressing   Problem: Safety: Goal: Ability to remain free from injury will improve Outcome: Progressing   

## 2021-12-22 NOTE — Plan of Care (Signed)

## 2021-12-29 ENCOUNTER — Ambulatory Visit: Payer: Medicare HMO | Attending: Cardiology | Admitting: Cardiology

## 2021-12-29 ENCOUNTER — Encounter: Payer: Self-pay | Admitting: Cardiology

## 2021-12-29 VITALS — BP 129/70 | HR 73 | Ht 60.0 in | Wt 126.4 lb

## 2021-12-29 DIAGNOSIS — I951 Orthostatic hypotension: Secondary | ICD-10-CM | POA: Diagnosis not present

## 2021-12-29 DIAGNOSIS — I10 Essential (primary) hypertension: Secondary | ICD-10-CM

## 2021-12-29 DIAGNOSIS — R55 Syncope and collapse: Secondary | ICD-10-CM

## 2021-12-29 DIAGNOSIS — E785 Hyperlipidemia, unspecified: Secondary | ICD-10-CM

## 2021-12-29 DIAGNOSIS — I341 Nonrheumatic mitral (valve) prolapse: Secondary | ICD-10-CM | POA: Diagnosis not present

## 2021-12-29 MED ORDER — LISINOPRIL 10 MG PO TABS: 10.0000 mg | ORAL_TABLET | Freq: Every day | ORAL | 3 refills | Status: DC

## 2021-12-29 MED ORDER — AMLODIPINE BESYLATE 2.5 MG PO TABS: 2.5000 mg | ORAL_TABLET | Freq: Every day | ORAL | 3 refills | Status: DC

## 2021-12-29 NOTE — Assessment & Plan Note (Signed)
On rosuvastatin.  Followed by PCP.

## 2021-12-29 NOTE — Assessment & Plan Note (Signed)
Difficult situation because of her orthostatic pressures here today, I am concerned about overdoing it with blood pressure control.  Would probably allow for permissive hypertension.  We will DC HCTZ portion of the lisinopril and HCTZ due to her orthostatic hypotension, recent dehydration with hyponatremia and hypokalemia.  Continue with 10 mg lisinopril, reduce amlodipine to 2.5 mg and take it in the evening.

## 2021-12-29 NOTE — Assessment & Plan Note (Signed)
Concerns of orthostatic hypotension dizziness and falling are probably more significant than concerns for hypertension.  Therefore allowing for permissive hypertension as noted.  Ensure adequate hydration, encouraged her to eat, and drink appropriately.  I explained to the husband that this is very crucial.  Plan:  Continue to reiterate importance of eating and drinking/appropriate hydration.  DC HCTZ and continue the lisinopril 10 mg daily,  Also decrease amlodipine to 2.5 mg daily and take in the evening.

## 2021-12-29 NOTE — Patient Instructions (Addendum)
Medication Instructions:  - Your physician has recommended you make the following change in your medication:    1) STOP Lisinopril-HCTZ  2) START plain Lisinopril 10 mg: - take 1 tablet by mouth once daily in the morning  3) DECREASE Amlodipine to 2.5 mg: - take 1 tablet by mouth once daily at bedtime  *If you need a refill on your cardiac medications before your next appointment, please call your pharmacy*   Lab Work: - none ordered  If you have labs (blood work) drawn today and your tests are completely normal, you will receive your results only by: [facility] [place] (if you have MyChart) OR A paper copy in the mail If you have any lab test that is abnormal or we need to change your treatment, we will call you to review the results.   Testing/Procedures: - none ordered   Follow-Up: At [facility], you and your health needs are our priority.  As part of our continuing mission to provide you with exceptional heart care, we have created designated Provider Care Teams.  These Care Teams include your primary Cardiologist (physician) and Advanced Practice Providers (APPs -  Physician Assistants and Nurse Practitioners) who all work together to provide you with the care you need, when you need it.  We recommend signing up for the patient portal called "MyChart".  Sign up information is provided on this After Visit Summary.  MyChart is used to connect with patients for Virtual Visits (Telemedicine).  Patients are able to view lab/test results, encounter notes, upcoming appointments, etc.  Non-urgent messages can be sent to your provider as well.   To learn more about what you can do with MyChart, go to [email or web address].    Your next appointment:   6 month(s)  The format for your next appointment:   In Person  Provider:   Glenetta Hew, MD    Other Instructions  Make sure to hydrate  Important Information About Sugar

## 2021-12-29 NOTE — Assessment & Plan Note (Signed)
Difficult scenario with the need for permissive hypertension and then having a mitral prolapse.  I do not really hear much on exam as far as any HSM, but the echo clearly showed significant MR.  We will need to reassess further out from when she was in the hospital profoundly dehydrated.  Plan: Recheck 2D echo in a year, but will reassess her symptom wise in 6 months.

## 2021-12-29 NOTE — Progress Notes (Signed)
Primary Care Provider: Baxter Hire, MD [place] Cardiologist: None Electrophysiologist: None  Clinic Note: Chief Complaint  Patient presents with   New Patient (Initial Visit)    Ref by Dr. Edwina Barth for follow up from [facility]; syncopal spells. Patient c/o shortness of breath with over exertion. Medications reviewed by the patient verbally.    ===================================  ASSESSMENT/PLAN   Problem List Items Addressed This Visit       Cardiology Problems   Essential hypertension (Chronic)    Difficult situation because of her orthostatic pressures here today, I am concerned about overdoing it with blood pressure control.  Would probably allow for permissive hypertension.  We will DC HCTZ portion of the lisinopril and HCTZ due to her orthostatic hypotension, recent dehydration with hyponatremia and hypokalemia.  Continue with 10 mg lisinopril, reduce amlodipine to 2.5 mg and take it in the evening.      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  I do not really hear much on exam as far as any HSM, but the echo clearly showed significant MR.  We will need to reassess further out from when she was in the hospital profoundly dehydrated.  Plan: Recheck 2D echo in a year, but will reassess her symptom wise in 6 months.       Relevant Medications   amLODipine (NORVASC) 2.5 MG tablet   lisinopril (ZESTRIL) 10 MG tablet   Other Relevant Orders   EKG 12-Lead   Orthostatic hypotension (Chronic)    Concerns of orthostatic hypotension dizziness and falling are probably more significant than concerns for hypertension.  Therefore allowing for permissive hypertension as noted.  Ensure adequate hydration,  encouraged her to eat, and drink appropriately.  I explained to the husband that this is very crucial.  Plan: Continue to reiterate importance of eating and drinking/appropriate hydration. DC HCTZ and continue the lisinopril 10 mg daily, Also decrease amlodipine to 2.5 mg daily and take in the evening.      Relevant Medications   amLODipine (NORVASC) 2.5 MG tablet   lisinopril (ZESTRIL) 10 MG tablet     Other   Syncope, vasovagal    I suspect that her syncope is related to orthostatic hypotension exacerbated by a vasovagal episode.  As per noted elsewhere, plan will be to allow permissive hypertension but not dramatic given the mitral prolapse. No symptoms suggest arrhythmia, however with MVP, need to consider the possibility of arrhythmia for future episodes and would consider monitor and possibly ILR.      Relevant Orders   EKG 12-Lead   Dyslipidemia (Chronic)    On rosuvastatin.  Followed by PCP.      ===================================  HPI:    Melissa Alvarado is a 81 y.o. female with PMH notable for asthma, CAD (noted in outside records, but I do not see evidence of any studies done here), GERD, HTN and dyslipidemia, MVP/MR as well as dementia is being seen today for the evaluation of post hospital follow-up at the request of Baxter Hire, MD.  Melissa Alvarado has not been seen by cardiologist in a long time.  Husband was not really able to corroborate much in the way of any new changes to her medical history.  He  provides most of the history.  Most notably no PND orthopnea or edema.  Some confusion which seems to getting worse along with moderate anxiety.  Recent Hospitalizations:  [facility] admission 10/18-20/2023: Presented with acute onset syncope.  Poor appetite not eating well.  Not drinking well.  Diarrhea the day before.  Noted to have hypokalemia hyponatremia and AKI. => Done showed normal EF with MVP and moderate MR. Treated with IV fluids with good response.  Back to  baseline by discharge.  Renal function improved.  Electrolyte abnormalities also repleted. No arrhythmia noted on telemetry  Reviewed  CV studies:    The following studies were reviewed today: (if available, images/films reviewed: From Epic Chart or Care Everywhere) TTE 12/21/2021: EF 60-65% . NO RWMA. GR1 DD.  Mildly elevated RVSP/PAP (~45 mmHg). MOd MR with mod late MVP. Aov Sclerosis without stenosis. Normal RAP.   Interval History:   Melissa Alvarado presents today along with her husband who is the main caregiver.  She herself is somewhat confused and is a very poor historian.  She really does not fully remember her hospital stay.  She recalls that she has home oxygen but she has not been using it very frequently.  Husband indicates that may be occasionally here and there she would block the home oxygen level and in the use for nasal congestion dyspnea with some chest tightness for that may last up to 15 minutes or so, but has been pretty rare.  Thankfully, he has not had any further syncope since that initial hospital stay overnight, PRN breakthrough chest pain.  Is not sure why she was 89 been drinking that much for she came into the hospital, but the combination of antihypertensives (including lisinopril and HCTZ), and hypertension led to exacerbation.  More prominently, she notes having recurrent symptoms of dizziness including simply sitting up in the chair today.  She was more dizzy upon initial arrival here.  Describes some symptoms consistent with   CV Review of Symptoms (Summary): positive for - dyspnea on exertion, edema, irregular heartbeat, and shortness of breath negative for - chest pain, dyspnea on exertion, edema, irregular heartbeat, loss of consciousness, palpitations, paroxysmal nocturnal dyspnea, rapid heart rate, or shortness of breath  REVIEWED OF SYSTEMS   Review of Systems  Constitutional:  Positive for malaise/fatigue and weight loss (Still not really eating all that  well but doing better).  HENT:  Negative for congestion and nosebleeds.   Respiratory:  Negative for sputum production and shortness of breath.   Cardiovascular:  Negative for leg swelling.  Gastrointestinal:  Positive for constipation. Negative for abdominal pain, blood in stool and melena.  Genitourinary:  Negative for hematuria.  Musculoskeletal:  Positive for joint pain.  Neurological:  Positive for dizziness. Negative for tingling, focal weakness and weakness.  Psychiatric/Behavioral:  Positive for memory loss. Negative for depression. The patient is nervous/anxious and has insomnia.    I have reviewed and (if needed) personally updated the patient's problem list, medications, allergies, past medical and surgical history, social and family history.   PAST MEDICAL HISTORY   Past Medical History:  Diagnosis Date   Asthma    Coronary artery disease    Esophageal dysphagia    GERD (gastroesophageal reflux disease)    occasional   Hyperlipidemia    Hypertension     PAST SURGICAL HISTORY   Past Surgical History:  Procedure Laterality Date   CARDIAC CATHETERIZATION     CATARACT EXTRACTION     CHOLECYSTECTOMY  COLONOSCOPY WITH PROPOFOL N/A 07/04/2015   Procedure: COLONOSCOPY WITH PROPOFOL;  Surgeon: Josefine Class, MD;  Location: [facility] ENDOSCOPY;  Service: Endoscopy;  Laterality: N/A;   CORONARY ANGIOPLASTY     DILATION AND CURETTAGE, DIAGNOSTIC / THERAPEUTIC     ESOPHAGOGASTRODUODENOSCOPY (EGD) WITH PROPOFOL N/A 03/09/2019   Procedure: ESOPHAGOGASTRODUODENOSCOPY (EGD) WITH PROPOFOL;  Surgeon: Toledo, Benay Pike, MD;  Location: [facility] ENDOSCOPY;  Service: Gastroenterology;  Laterality: N/A;   HYSTEROSCOPY WITH D & C N/A 08/26/2015   Procedure: DILATATION AND CURETTAGE /HYSTEROSCOPY;  Surgeon: Lorette Ang, MD;  Location: [facility] ORS;  Service: Gynecology;  Laterality: N/A;   TUBAL LIGATION       There is no immunization history on file for this  patient.  MEDICATIONS/ALLERGIES   Current Meds  Medication Sig   albuterol (ACCUNEB) 1.25 MG/3ML nebulizer solution Take 1 ampule by nebulization every 6 (six) hours as needed for wheezing.   albuterol (PROAIR HFA) 108 (90 Base) MCG/ACT inhaler Inhale 2 puffs into the lungs every 4 (four) hours as needed.   amLODipine (NORVASC) 2.5 MG tablet Take 1 tablet (2.5 mg total) by mouth daily.   azelastine (ASTELIN) 0.1 % nasal spray Place 1 spray into both nostrils 2 (two) times daily. Use in each nostril as directed   clotrimazole-betamethasone (LOTRISONE) lotion Apply topically 2 (two) times daily.   Fluticasone-Salmeterol (ADVAIR) 250-50 MCG/DOSE AEPB Inhale 1 puff into the lungs 2 (two) times daily.   gabapentin (NEURONTIN) 100 MG capsule Take 100 mg by mouth 3 (three) times daily.   lisinopril (ZESTRIL) 10 MG tablet Take 1 tablet (10 mg total) by mouth daily.   magnesium oxide (MAG-OX) 400 (240 Mg) MG tablet Take 1 tablet (400 mg total) by mouth 2 (two) times daily for 7 days.   montelukast (SINGULAIR) 10 MG tablet Take 10 mg by mouth at bedtime.   pantoprazole (PROTONIX) 40 MG tablet Take 40 mg by mouth daily.   potassium chloride (K-DUR,KLOR-CON) 10 MEQ tablet Take 10 mEq by mouth daily.   QUEtiapine (SEROQUEL) 25 MG tablet Take 1 tablet (25 mg total) by mouth at bedtime for 14 days.   rosuvastatin (CRESTOR) 10 MG tablet Take 10 mg by mouth daily.   tiZANidine (ZANAFLEX) 4 MG tablet Take 4 mg by mouth every 6 (six) hours as needed for muscle spasms.   [DISCONTINUED] amLODipine (NORVASC) 5 MG tablet Take 1 tablet (5 mg total) by mouth daily.   [DISCONTINUED] lisinopril-hydrochlorothiazide (ZESTORETIC) 10-12.5 MG tablet Take 1 tablet by mouth daily.    Allergies  Allergen Reactions   Sodium Chloride Rash   Atorvastatin     unknown   Penicillins Other (See Comments)    Has patient had a PCN reaction causing immediate rash, facial/tongue/throat swelling, SOB or lightheadedness with  hypotension: unknown Has patient had a PCN reaction causing severe rash involving mucus membranes or skin necrosis: unknown Has patient had a PCN reaction that required hospitalization unknown Has patient had a PCN reaction occurring within the last 10 years: unknown If all of the above answers are "NO", then may proceed with Cephalosporin use.  Has patient had a PCN reaction causing immediate rash, facial/tongue/throat swelling, SOB or lightheadedness with hypotension: unknown Has patient had a PCN reaction causing severe rash involving mucus membranes or skin necrosis: unknown Has patient had a PCN reaction that required hospitalization unknown Has patient had a PCN reaction occurring within the last 10 years: unknown If all of the above answers are "NO", then may proceed with Cephalosporin  use.   Sulfa Antibiotics Itching   Zetia [Ezetimibe]     unknown    SOCIAL HISTORY/FAMILY HISTORY   Reviewed in Epic:   Social History   Tobacco Use   Smoking status: Never   Smokeless tobacco: Never  Vaping Use   Vaping Use: Never used  Substance Use Topics   Alcohol use: No   Drug use: No   Social History   Social History Narrative   Not on file   Family History  Problem Relation Age of Onset   Hyperlipidemia Father    Breast cancer Sister     Family History  Problem Relation Age of Onset  Diabetes type II Mother  Kidney disease Mother  Aneurysm Father  Intracranial  Aneurysm Sister  Kidney disease Brother  Colon cancer Neg Hx  Colon polyps Neg Hx   Social History: reports that she has never smoked. She has never used smokeless tobacco. She reports that she does not drink alcohol and does not use drugs.  OBJCTIVE -PE, EKG, labs   Wt Readings from Last 3 Encounters:  12/29/21 126 lb 6 oz (57.3 kg)  12/22/21 132 lb 14.4 oz (60.3 kg)  11/07/19 170 lb (77.1 kg)    Physical Exam: BP 129/70 (BP Location: Left Arm, Patient Position: Sitting, Cuff Size: Normal)   Pulse  73   Ht 5' (1.524 m)   Wt 126 lb 6 oz (57.3 kg)   SpO2 97%   BMI 24.68 kg/m  Orthostatic VS for the past 24 hrs (Last 3 readings):  BP- Lying Pulse- Lying BP- Sitting Pulse- Sitting BP- Standing at 0 minutes Pulse- Standing at 0 minutes BP- Standing at 3 minutes Pulse- Standing at 3 minutes  12/29/21 1021 135/65 75 122/65 79 98/58 91 120/66 86    Physical Exam Vitals reviewed.  Constitutional:      General: She is not in acute distress.    Appearance: Normal appearance. She is normal weight. She is not ill-appearing or toxic-appearing.  HENT:     Head: Normocephalic and atraumatic.     Nose: Congestion present.  Eyes:     Extraocular Movements: Extraocular movements intact.     Pupils: Pupils are equal, round, and reactive to light.  Neck:     Vascular: No carotid bruit or JVD.  Cardiovascular:     Rate and Rhythm: Normal rate and regular rhythm. Extrasystoles are present.    Chest Wall: PMI is not displaced.     Pulses: Intact distal pulses. Decreased pulses (Diminished pedal pulses, but even bilaterally.  Warm feet.).     Heart sounds: S1 normal and S2 normal. Heart sounds are distant. Murmur (1-2/6 SEM at RUSB.) heard.     No friction rub. No gallop.  Pulmonary:     Effort: Pulmonary effort is normal. No respiratory distress.     Breath sounds: Normal breath sounds. No stridor. No wheezing, rhonchi or rales.  Abdominal:     General: Abdomen is flat. Bowel sounds are normal. There is no distension.     Palpations: Abdomen is soft. There is no mass.     Tenderness: There is no abdominal tenderness.  Musculoskeletal:        General: No swelling. Normal range of motion.     Cervical back: Normal range of motion.  Skin:    General: Skin is warm and dry.  Neurological:     General: No focal deficit present.     Mental Status: She is alert. Mental status is  at baseline.     Cranial Nerves: Cranial nerve deficit present.     Motor: Weakness (Left arm) present.     Gait: Gait  normal.     Comments: Oriented to person place  Psychiatric:        Mood and Affect: Mood normal.     Comments: A little confused and anxious, but otherwise stable     Adult ECG Report  Rate: 73 ;  Rhythm: normal sinus rhythm and left atrial enlargement.  Otherwise normal axis, intervals and durations. ;   Narrative Interpretation: Stable  Recent Labs: Reviewed No results found for: "CHOL", "HDL", "LDLCALC", "LDLDIRECT", "TRIG", "CHOLHDL" Lab Results  Component Value Date   CREATININE 0.95 12/22/2021   BUN 12 12/22/2021   NA 133 (L) 12/22/2021   K 3.6 12/22/2021   CL 99 12/22/2021   CO2 27 12/22/2021      Latest Ref Rng & Units 12/21/2021    5:09 AM 12/20/2021   12:11 PM 08/18/2015   11:33 AM  CBC  WBC 4.0 - 10.5 K/uL 8.8  7.7  5.3   Hemoglobin 12.0 - 15.0 g/dL 11.2  11.0  12.4   Hematocrit 36.0 - 46.0 % 34.4  33.8  38.4   Platelets 150 - 400 K/uL 258  247  276     No results found for: "HGBA1C" No results found for: "TSH"  ================================================== I spent a total of 28 minutes with the patient spent in direct patient consultation.  Additional time spent with chart review  / charting (studies, outside notes, etc):33 min Total Time: 61 min  Current medicines are reviewed at length with the patient today.  (+/- concerns) none  Notice: This dictation was prepared with Dragon dictation along with smart phrase technology. Any transcriptional errors that result from this process are unintentional and may not be corrected upon review.   Studies Ordered:  Orders Placed This Encounter  Procedures   EKG 12-Lead   Meds ordered this encounter  Medications   amLODipine (NORVASC) 2.5 MG tablet    Sig: Take 1 tablet (2.5 mg total) by mouth daily.    Dispense:  90 tablet    Refill:  3    Dose decrease   lisinopril (ZESTRIL) 10 MG tablet    Sig: Take 1 tablet (10 mg total) by mouth daily.    Dispense:  90 tablet    Refill:  3    Stopping  lisinopril-hctz    Patient Instructions / Medication Changes & Studies & Tests Ordered   Patient Instructions  Medication Instructions:  - Your physician has recommended you make the following change in your medication:    1) STOP Lisinopril-HCTZ  2) START plain Lisinopril 10 mg: - take 1 tablet by mouth once daily in the morning  3) DECREASE Amlodipine to 2.5 mg: - take 1 tablet by mouth once daily at bedtime  *If you need a refill on your cardiac medications before your next appointment, please call your pharmacy*   Lab Work: - none ordered  If you have labs (blood work) drawn today and your tests are completely normal, you will receive your results only by: MyChart Message (if you have MyChart) OR A paper copy in the mail If you have any lab test that is abnormal or we need to change your treatment, we will call you to review the results.   Testing/Procedures: - none ordered   Follow-Up: At [facility], you and your health needs are our priority.  As part of our continuing mission to provide you with exceptional heart care, we have created designated Provider Care Teams.  These Care Teams include your primary Cardiologist (physician) and Advanced Practice Providers (APPs -  Physician Assistants and Nurse Practitioners) who all work together to provide you with the care you need, when you need it.  We recommend signing up for the patient portal called "MyChart".  Sign up information is provided on this After Visit Summary.  MyChart is used to connect with patients for Virtual Visits (Telemedicine).  Patients are able to view lab/test results, encounter notes, upcoming appointments, etc.  Non-urgent messages can be sent to your provider as well.   To learn more about what you can do with MyChart, go to [email or web address].    Your next appointment:   6 month(s)  The format for your next appointment:   In Person  Provider:   Glenetta Hew, MD    Other  Instructions  Make sure to hydrate  Important Information About Sugar           Leonie Man, MD, MS Glenetta Hew, M.D., M.S. Interventional Cardiologist  [id number]   [address]; [place], [place]  [address] [phone]           Fax [phone]    Thank you for choosing [facility] in [place]!!

## 2021-12-29 NOTE — Assessment & Plan Note (Signed)
I suspect that her syncope is related to orthostatic hypotension exacerbated by a vasovagal episode.  As per noted elsewhere, plan will be to allow permissive hypertension but not dramatic given the mitral prolapse. No symptoms suggest arrhythmia, however with MVP, need to consider the possibility of arrhythmia for future episodes and would consider monitor and possibly ILR.

## 2022-03-14 ENCOUNTER — Other Ambulatory Visit: Payer: Self-pay

## 2022-03-14 ENCOUNTER — Encounter: Payer: Self-pay | Admitting: Emergency Medicine

## 2022-03-14 ENCOUNTER — Emergency Department: Payer: Medicare HMO

## 2022-03-14 ENCOUNTER — Emergency Department
Admission: EM | Admit: 2022-03-14 | Discharge: 2022-03-14 | Disposition: A | Discharge status: Home / Self Care | Payer: Medicare HMO | Attending: Emergency Medicine | Admitting: Emergency Medicine

## 2022-03-14 DIAGNOSIS — J45909 Unspecified asthma, uncomplicated: Secondary | ICD-10-CM | POA: Diagnosis not present

## 2022-03-14 DIAGNOSIS — F0390 Unspecified dementia without behavioral disturbance: Secondary | ICD-10-CM | POA: Insufficient documentation

## 2022-03-14 DIAGNOSIS — I2510 Atherosclerotic heart disease of native coronary artery without angina pectoris: Secondary | ICD-10-CM | POA: Insufficient documentation

## 2022-03-14 DIAGNOSIS — I10 Essential (primary) hypertension: Secondary | ICD-10-CM | POA: Insufficient documentation

## 2022-03-14 DIAGNOSIS — R55 Syncope and collapse: Secondary | ICD-10-CM | POA: Insufficient documentation

## 2022-03-14 LAB — URINALYSIS, ROUTINE W REFLEX MICROSCOPIC
Bacteria, UA: NONE SEEN
Bilirubin Urine: NEGATIVE
Glucose, UA: NEGATIVE mg/dL
Ketones, ur: 5 mg/dL — AB
Leukocytes,Ua: NEGATIVE
Nitrite: NEGATIVE
Protein, ur: 100 mg/dL — AB
Specific Gravity, Urine: 1.009 (ref 1.005–1.030)
pH: 6 (ref 5.0–8.0)

## 2022-03-14 LAB — BASIC METABOLIC PANEL
Anion gap: 9 (ref 5–15)
BUN: 23 mg/dL (ref 8–23)
CO2: 18 mmol/L — ABNORMAL LOW (ref 22–32)
Calcium: 8.3 mg/dL — ABNORMAL LOW (ref 8.9–10.3)
Chloride: 105 mmol/L (ref 98–111)
Creatinine, Ser: 1.51 mg/dL — ABNORMAL HIGH (ref 0.44–1.00)
GFR, Estimated: 34 mL/min — ABNORMAL LOW (ref >60)
Glucose, Bld: 101 mg/dL — ABNORMAL HIGH (ref 70–99)
Potassium: 3.6 mmol/L (ref 3.5–5.1)
Sodium: 132 mmol/L — ABNORMAL LOW (ref 135–145)

## 2022-03-14 LAB — CBC
HCT: 35.9 % — ABNORMAL LOW (ref 36.0–46.0)
Hemoglobin: 11.5 g/dL — ABNORMAL LOW (ref 12.0–15.0)
MCH: 26.3 pg (ref 26.0–34.0)
MCHC: 32 g/dL (ref 30.0–36.0)
MCV: 82.2 fL (ref 80.0–100.0)
Platelets: 232 10*3/uL (ref 150–400)
RBC: 4.37 MIL/uL (ref 3.87–5.11)
RDW: 15.1 % (ref 11.5–15.5)
WBC: 3.8 10*3/uL — ABNORMAL LOW (ref 4.0–10.5)
nRBC: 0 % (ref 0.0–0.2)

## 2022-03-14 LAB — TROPONIN I (HIGH SENSITIVITY)
Troponin I (High Sensitivity): 12 ng/L (ref <18)
Troponin I (High Sensitivity): 13 ng/L (ref <18)

## 2022-03-14 MED ORDER — LACTATED RINGERS IV BOLUS
1000.0000 mL | Freq: Once | INTRAVENOUS | Status: AC
  Administered 2022-03-14: 1000 mL via INTRAVENOUS

## 2022-03-14 NOTE — Discharge Instructions (Signed)
I suspect that the loss of consciousness was due to dehydration.  Please make sure she is standing up slowly and drinking is much as possible.  If she develops confusion fever or any new symptoms that are concerning to you please return to the emergency department.

## 2022-03-14 NOTE — ED Notes (Signed)
Pt ambulated with spouse

## 2022-03-14 NOTE — ED Provider Notes (Signed)
[facility] Provider Note    Event Date/Time   First MD Initiated Contact with Patient 03/14/22 1038     (approximate)   History   Loss of Consciousness   HPI  Melissa Alvarado is a 82 y.o. female past medical history of coronary artery disease, hypertension, hyperlipidemia, asthma, dementia who presents with syncope.  Patient is accompanied by her husband who provides the history.  He got her out of bed today they were walking to the bathroom when she collapsed.  Says that her eyes rolled back and she looked ashen and was unresponsive.  He called EMS.  Apparently when EMS was there she syncopized again.  There was no seizure-like activity.  She did urinate on herself.  Had several episodes of emesis as well.  Patient's husband notes that at baseline she has dementia although not formally diagnosed.  He notes that she is at her baseline cognitive status currently.  At baseline she does not eat and drink very well.  Has had admission for syncope about 2 months ago which was presumed to be due to dehydration.  She has not had any recent illnesses including fevers cough nausea vomiting diarrhea.  Patient tells me she is not currently having any pain denies chest pain or shortness of breath.     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   Hypophosphatemia 12/22/2021   Orthostatic hypotension 12/21/2021   Syncope, vasovagal 12/21/2021   AKI (acute kidney injury) ([place]) 12/21/2021   Hypokalemia 12/21/2021   GERD without esophagitis 12/21/2021   Essential hypertension 12/21/2021   Dyslipidemia 12/21/2021   Dyspepsia 12/21/2021   Anorexia 12/21/2021     Physical Exam  Triage Vital Signs: ED Triage Vitals   Enc Vitals Group     BP 03/14/22 0954 (!) 164/63     Pulse Rate 03/14/22 0954 64     Resp 03/14/22 0954 16     Temp 03/14/22 0954 98.6 F (37 C)     Temp Source 03/14/22 0954 Oral     SpO2 03/14/22 0954 100 %     Weight 03/14/22 0952 115 lb (52.2 kg)     Height 03/14/22 0952 '5\' 4"'$  (1.626 m)     Head Circumference --      Peak Flow --      Pain Score 03/14/22 0952 0     Pain Loc --      Pain Edu? --      Excl. in [place]? --     Most recent vital signs: Vitals:   03/14/22 0954 03/14/22 1348  BP: (!) 164/63 (!) 175/87  Pulse: 64 68  Resp: 16 16  Temp: 98.6 F (37 C) 98.4 F (36.9 C)  SpO2: 100% 100%     General: Awake, no distress.  CV:  Good peripheral perfusion. No edema  Resp:  Normal effort.  Abd:  No distention.  Neuro:             Awake, Alert, Oriented x 2 Other:     ED Results / Procedures / Treatments  Labs (all labs ordered are listed, but only abnormal results are displayed) Labs Reviewed  BASIC METABOLIC PANEL - Abnormal; Notable  for the following components:      Result Value   Sodium 132 (*)    CO2 18 (*)    Glucose, Bld 101 (*)    Creatinine, Ser 1.51 (*)    Calcium 8.3 (*)    GFR, Estimated 34 (*)    All other components within normal limits  CBC - Abnormal; Notable for the following components:   WBC 3.8 (*)    Hemoglobin 11.5 (*)    HCT 35.9 (*)    All other components within normal limits  URINALYSIS, ROUTINE W REFLEX MICROSCOPIC - Abnormal; Notable for the following components:   Color, Urine YELLOW (*)    APPearance CLEAR (*)    Hgb urine dipstick MODERATE (*)    Ketones, ur 5 (*)    Protein, ur 100 (*)    All other components within normal limits  CBG MONITORING, ED  TROPONIN I (HIGH SENSITIVITY)  TROPONIN I (HIGH SENSITIVITY)     EKG  EKG interpretation performed by myself: NSR, nml axis, nml intervals, no acute ischemic changes    RADIOLOGY I reviewed and interpreted the CXR which does not show any acute cardiopulmonary  process    PROCEDURES:  Critical Care performed: No  Procedures    MEDICATIONS ORDERED IN ED: Medications  lactated ringers bolus 1,000 mL (1,000 mLs Intravenous New Bag/Given 03/14/22 1148)     IMPRESSION / MDM / ASSESSMENT AND PLAN / ED COURSE  I reviewed the triage vital signs and the nursing notes.                              Patient's presentation is most consistent with acute complicated illness / injury requiring diagnostic workup.  Differential diagnosis includes, but is not limited to, Vasovagal syncope, orthostatic syncope, cardiac arrhythmia, anemia, dehydration, AKI   The patient is an 82 year old female with underlying dementia presents after syncopal episode.  Her husband provides a history she is not able to say much.  He got her up from bed and he was assisting her to the bathroom when she lost consciousness and went limp.  He caught her so she did not fall to the ground.  Send she looked ashen and was unresponsive.  When she came to was still a bit confused.  Apparently she had a possible syncopal episode with EMS as well per husband although he was not there.  Per triage note this occurred when she was stood up to get on the EMS stretcher.  He received 500 cc bolus with EMS.  Initial blood pressure was 160/63.  Patient is oriented to person only, per her husband this is her baseline.  He notes that she chronically does not eat or drink much.  Otherwise has not had any new infectious symptoms patient is able to tell me she is not having chest pain or dyspnea.  EKG shows sinus rhythm without concerning features.  Labs do show an AKI, bicarb 18 but no anion gap.  Troponins are negative x 2.  Chest x-ray is clear urinalysis does not suggest infection.  Patient was admitted about 3 months ago for similar episode which was thought to be due to hypovolemia.  Will give a bolus of 1 L of normal saline.  If patient is able to sit up without being syncopal I think she will be  appropriate for discharge.    After fluid bolus patient was able to ambulate with assistance as she  normally would without lightheadedness or syncope.  She is appropriate for discharge at this time.   FINAL CLINICAL IMPRESSION(S) / ED DIAGNOSES   Final diagnoses:  Syncope, unspecified syncope type     Rx / DC Orders   ED Discharge Orders     None        Note:  This document was prepared using Dragon voice recognition software and may include unintentional dictation errors.   Rada Hay, MD 03/14/22 [phone]

## 2022-03-14 NOTE — ED Provider Triage Note (Signed)
Emergency Medicine Provider Triage Evaluation Note  Italy Warriner , a 82 y.o. female  was evaluated in triage.  Pt complains of syncopal episode this morning per EMS.  Also near episode of syncope getting to the stretcher per EMS.  Patient states she has not felt well for several days.  She denies any specific complaints at this time.  Review of Systems  Positive: History of asthma. Negative: No nausea, vomiting or diarrhea.  Denies upper respiratory symptoms.  Physical Exam  BP (!) 164/63 (BP Location: Right Arm)   Pulse 64   Temp 98.6 F (37 C) (Oral)   Resp 16   Ht '5\' 4"'$  (1.626 m)   Wt 52.2 kg   SpO2 100%   BMI 19.74 kg/m  Gen:   Awake, no distress   Resp:  Normal effort.  Lungs are clear no wheezing noted. MSK:   Moves extremities without difficulty  Other:    Medical Decision Making  Medically screening exam initiated at 10:00 AM.  Appropriate orders placed.  Desirea Mizrahi was informed that the remainder of the evaluation will be completed by another provider, this initial triage assessment does not replace that evaluation, and the importance of remaining in the ED until their evaluation is complete.     Johnn Hai, PA-C 03/14/22 1002

## 2022-03-14 NOTE — ED Triage Notes (Signed)
Pt via EMS from home. Pt had 2 syncopal episode this AM. Pt normally walks with assistance. First episode was when she was walking to the bathroom. Pt then stood up to get her on the stretcher and had another syncopal episode. Pt has been having loss of appetite and fatigue for the past couple of episode. Denies pain. Pt is A&Ox4 and NAD.  18 G L AC, EMS gave 532m of fluid and '4mg'$  of Zofran NSR 60  110 CBG  162/86

## 2022-04-05 DEATH — deceased

## 2022-07-05 ENCOUNTER — Ambulatory Visit: Payer: Medicare HMO | Admitting: Cardiology
